# Patient Record
Sex: Female | Born: 1975 | Race: White | Hispanic: No | Marital: Married | Smoking: Former smoker
Health system: Southern US, Community
[De-identification: ages and names within clinical notes are randomized; demographics above are authoritative.]

## PROBLEM LIST (undated history)

## (undated) DIAGNOSIS — K9189 Other postprocedural complications and disorders of digestive system: Secondary | ICD-10-CM

## (undated) DIAGNOSIS — N939 Abnormal uterine and vaginal bleeding, unspecified: Secondary | ICD-10-CM

## (undated) DIAGNOSIS — K219 Gastro-esophageal reflux disease without esophagitis: Secondary | ICD-10-CM

## (undated) DIAGNOSIS — D649 Anemia, unspecified: Secondary | ICD-10-CM

## (undated) DIAGNOSIS — E079 Disorder of thyroid, unspecified: Secondary | ICD-10-CM

## (undated) DIAGNOSIS — F419 Anxiety disorder, unspecified: Secondary | ICD-10-CM

## (undated) DIAGNOSIS — T7840XA Allergy, unspecified, initial encounter: Secondary | ICD-10-CM

## (undated) DIAGNOSIS — Z87891 Personal history of nicotine dependence: Secondary | ICD-10-CM

## (undated) DIAGNOSIS — Z800 Family history of malignant neoplasm of digestive organs: Secondary | ICD-10-CM

## (undated) DIAGNOSIS — R197 Diarrhea, unspecified: Secondary | ICD-10-CM

## (undated) HISTORY — PX: TUBAL LIGATION: SHX77

## (undated) HISTORY — PX: CERVICAL BIOPSY: SHX590

## (undated) HISTORY — DX: Gastro-esophageal reflux disease without esophagitis: K21.9

## (undated) HISTORY — PX: DIAGNOSTIC LAPAROSCOPY: SUR761

## (undated) HISTORY — DX: Allergy, unspecified, initial encounter: T78.40XA

## (undated) HISTORY — DX: Disorder of thyroid, unspecified: E07.9

---

## 2005-07-13 ENCOUNTER — Emergency Department: Payer: Self-pay | Admitting: General Practice

## 2006-12-13 ENCOUNTER — Ambulatory Visit: Payer: Self-pay

## 2007-01-23 ENCOUNTER — Observation Stay: Payer: Self-pay | Admitting: Obstetrics and Gynecology

## 2007-01-24 ENCOUNTER — Inpatient Hospital Stay: Payer: Self-pay | Admitting: Obstetrics and Gynecology

## 2009-03-26 HISTORY — PX: CHOLECYSTECTOMY: SHX55

## 2009-05-10 ENCOUNTER — Ambulatory Visit: Payer: Self-pay | Admitting: Family Medicine

## 2009-06-20 ENCOUNTER — Emergency Department: Payer: Self-pay | Admitting: Emergency Medicine

## 2009-06-23 ENCOUNTER — Ambulatory Visit: Payer: Self-pay | Admitting: Surgery

## 2010-05-03 ENCOUNTER — Ambulatory Visit: Payer: Self-pay | Admitting: Obstetrics and Gynecology

## 2010-05-04 ENCOUNTER — Inpatient Hospital Stay: Payer: Self-pay | Admitting: Obstetrics and Gynecology

## 2010-05-05 LAB — PATHOLOGY REPORT

## 2011-12-17 IMAGING — US ABDOMEN ULTRASOUND
1 series · 17 of 25 positions shown · non-contrast
Comparison: none

REASON FOR EXAM: cholelithiasis
COMMENTS:

[Series 1: abdomen ultrasound · 17 of 59 slices shown]
[im 1/59]
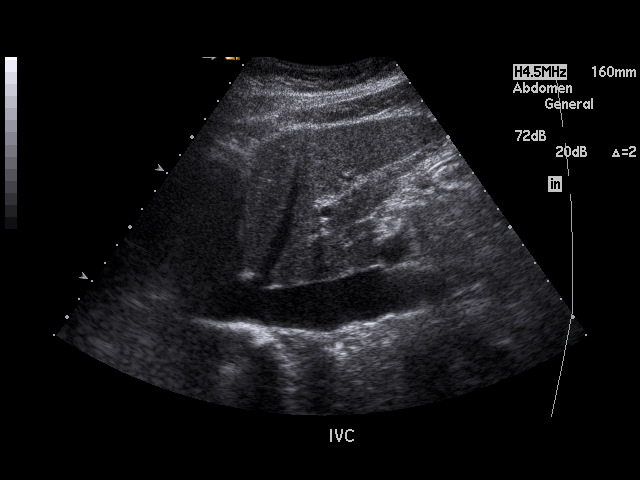
[im 5/59]
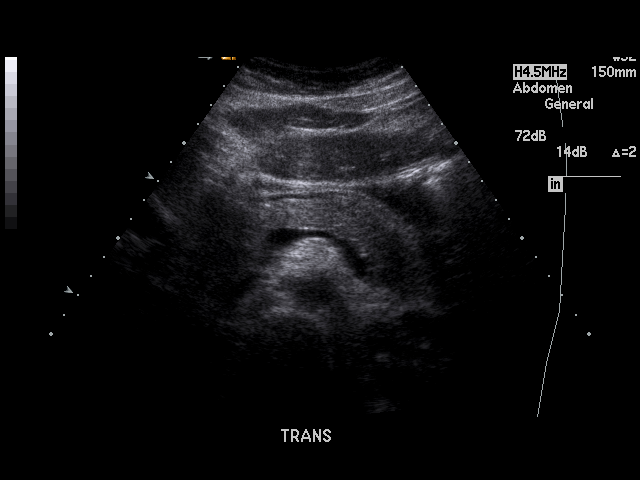
[im 8/59]
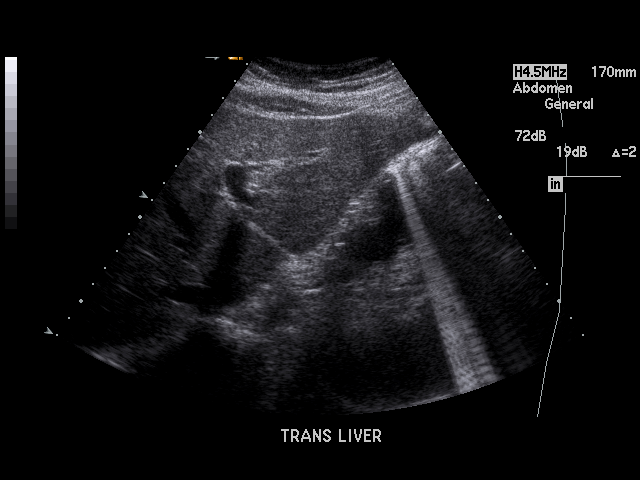
[im 13/59]
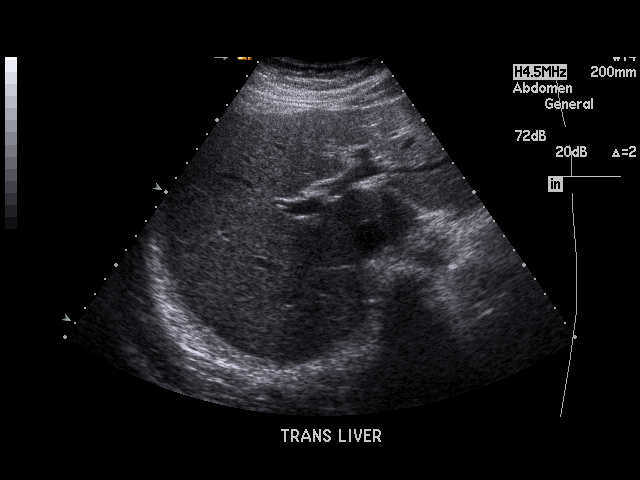
[im 15/59]
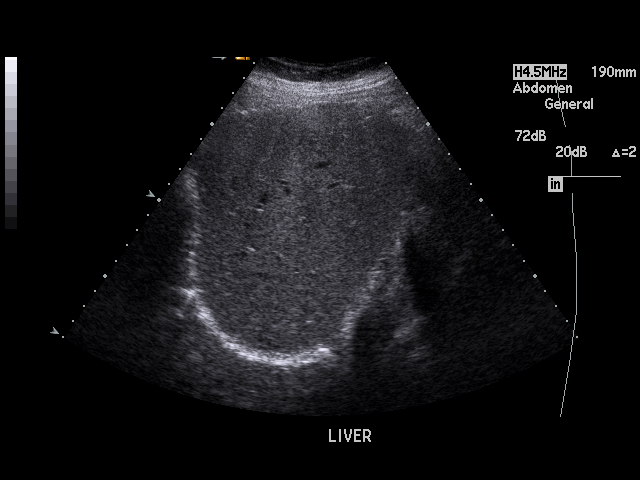
[im 20/59]
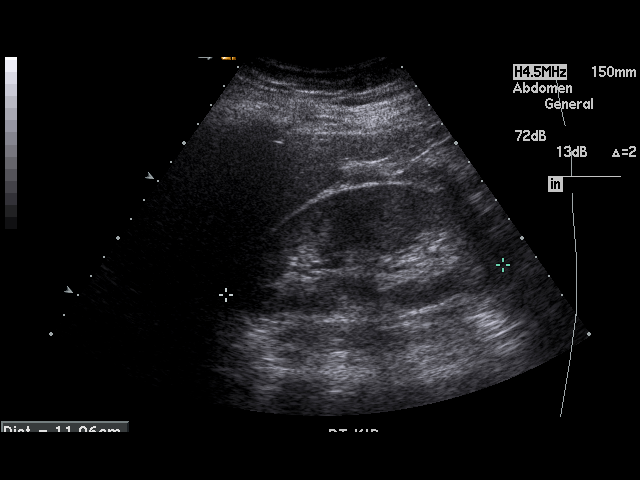
[im 22/59]
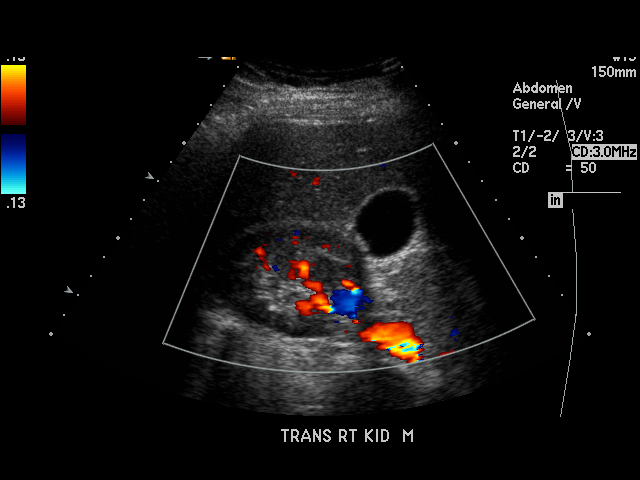
[im 27/59]
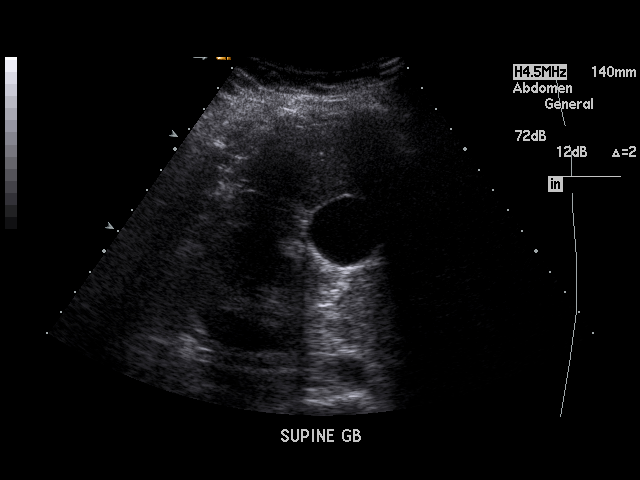
[im 30/59]
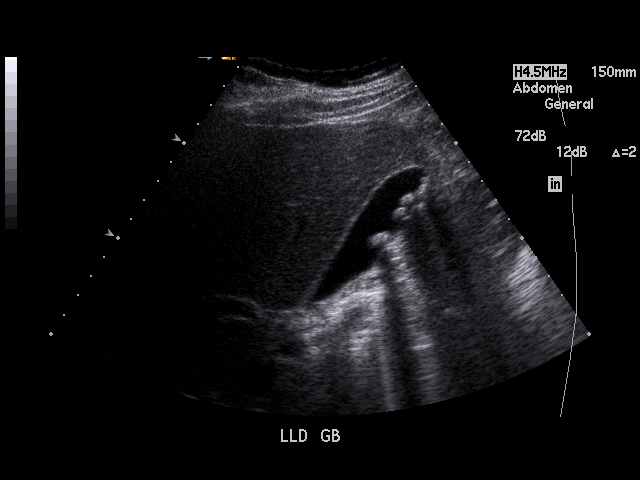
[im 32/59]
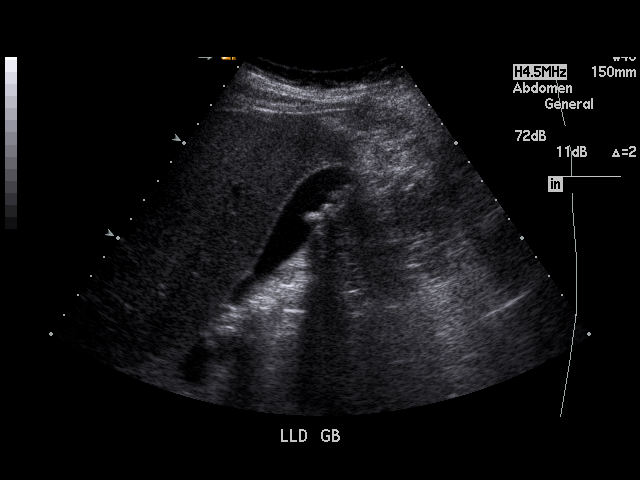
[im 37/59]
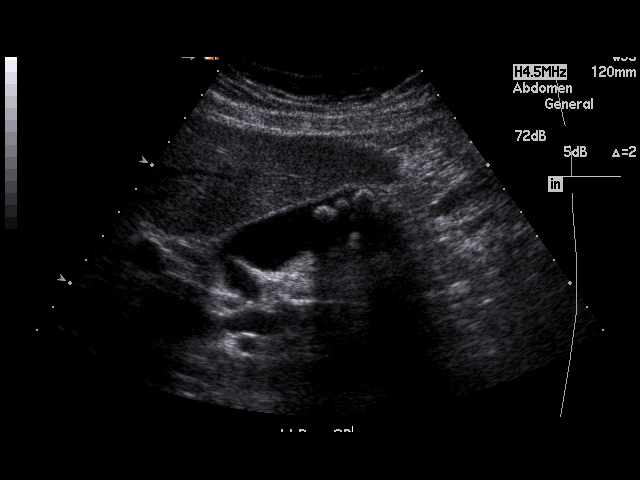
[im 39/59]
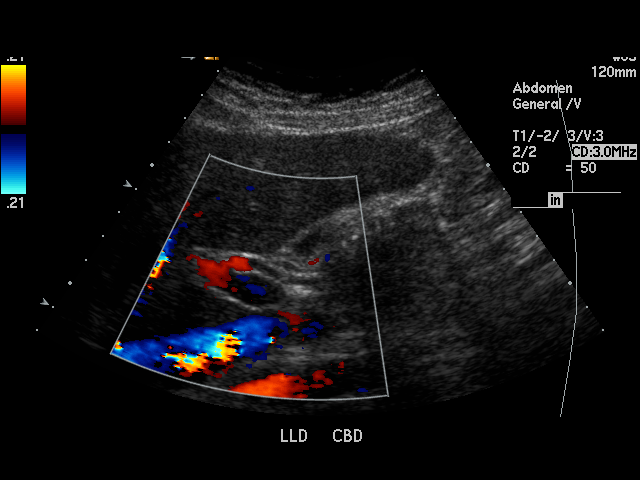
[im 44/59]
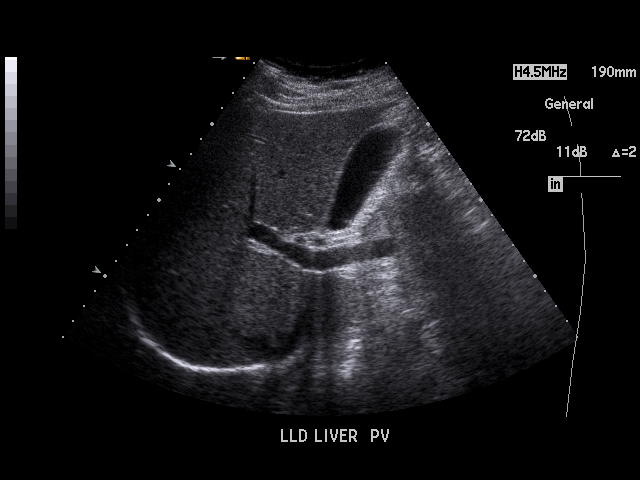
[im 46/59]
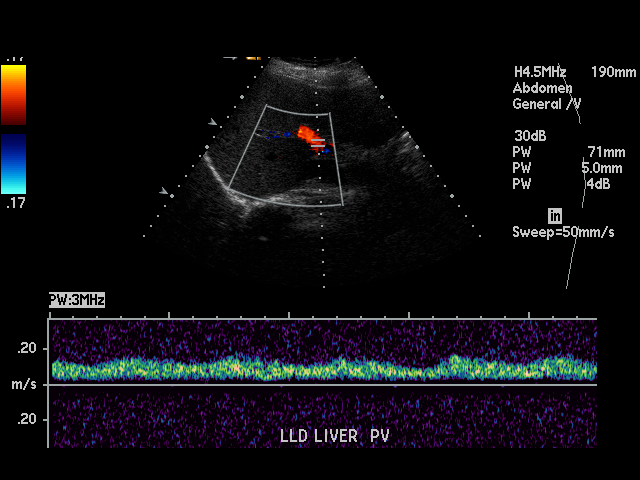
[im 51/59]
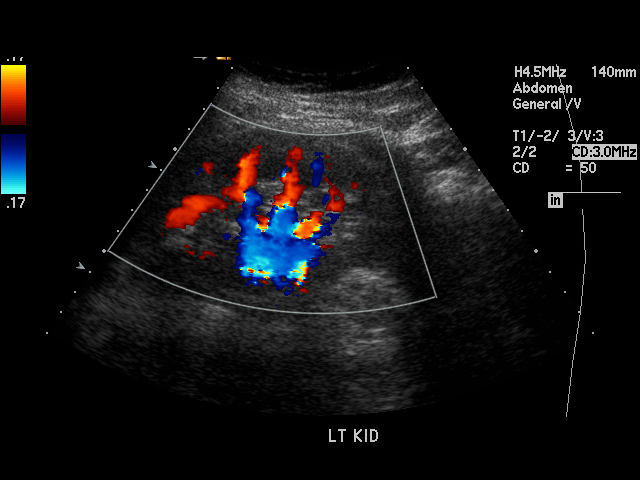
[im 54/59]
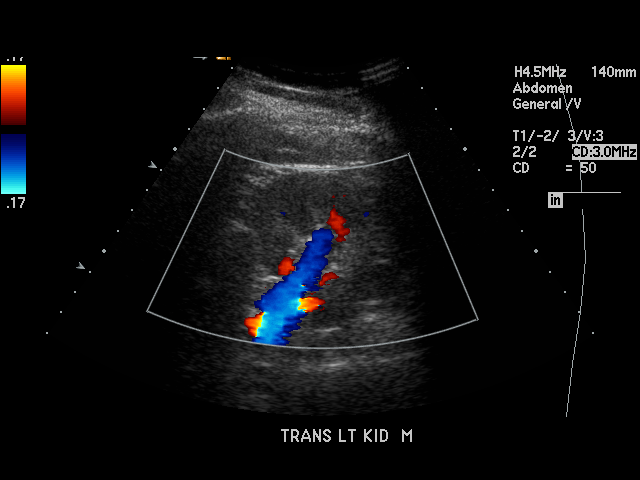
[im 59/59]
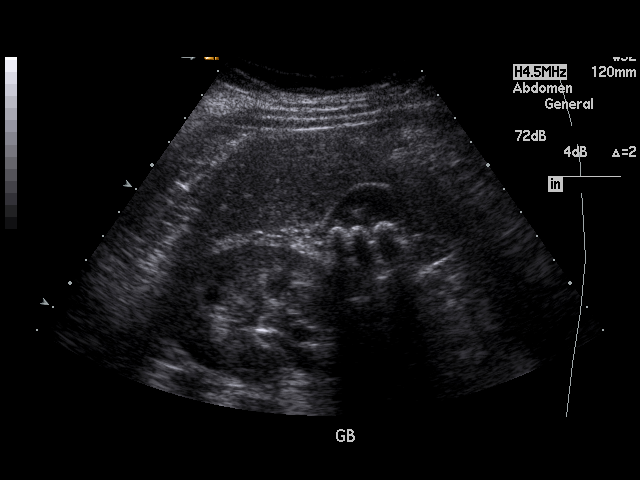

[17 of 25 positions shown; findings below may reference images not displayed]

PROCEDURE:     US  - US ABDOMEN GENERAL SURVEY  - May 10, 2009  [DATE]

RESULT:     The liver, spleen, pancreas, abdominal aorta and inferior vena
cava show no significant abnormalities. There are multiple, mobile
echodensities in the gallbladder compatible with gallstones. There is no
thickening of the gallbladder wall which measures 1.9 mm in thickness. The
common bile duct is normal in size and measures 4.2 mm at maximum diameter.
The right and left kidneys are visualized. No hydronephrosis is seen. There
is no ascites.
IMPRESSION: Cholelithiasis. No associated thickening of the gallbladder
wall or pericholecystic fluid is seen.

## 2013-10-05 LAB — BASIC METABOLIC PANEL
BUN: 10 mg/dL (ref 4–21)
Creatinine: 0.6 mg/dL (ref 0.5–1.1)
GLUCOSE: 77 mg/dL
Potassium: 4.2 mmol/L (ref 3.4–5.3)
Sodium: 139 mmol/L (ref 137–147)

## 2013-10-05 LAB — CBC AND DIFFERENTIAL
HCT: 39 % (ref 36–46)
HEMOGLOBIN: 13.5 g/dL (ref 12.0–16.0)
Platelets: 375 10*3/uL (ref 150–399)
WBC: 7.8 10*3/mL

## 2013-10-05 LAB — HEPATIC FUNCTION PANEL
ALT: 21 U/L (ref 7–35)
AST: 12 U/L — AB (ref 13–35)

## 2013-10-12 ENCOUNTER — Ambulatory Visit: Payer: Self-pay | Admitting: Family Medicine

## 2014-12-02 ENCOUNTER — Ambulatory Visit (INDEPENDENT_AMBULATORY_CARE_PROVIDER_SITE_OTHER): Payer: BLUE CROSS/BLUE SHIELD | Admitting: Family Medicine

## 2014-12-02 ENCOUNTER — Encounter: Payer: Self-pay | Admitting: Family Medicine

## 2014-12-02 VITALS — BP 100/68 | HR 72 | Temp 98.2°F | Resp 16 | Ht 65.0 in | Wt 174.0 lb

## 2014-12-02 DIAGNOSIS — Z9049 Acquired absence of other specified parts of digestive tract: Secondary | ICD-10-CM

## 2014-12-02 DIAGNOSIS — K9189 Other postprocedural complications and disorders of digestive system: Secondary | ICD-10-CM

## 2014-12-02 DIAGNOSIS — E049 Nontoxic goiter, unspecified: Secondary | ICD-10-CM

## 2014-12-02 DIAGNOSIS — R197 Diarrhea, unspecified: Secondary | ICD-10-CM

## 2014-12-02 DIAGNOSIS — J309 Allergic rhinitis, unspecified: Secondary | ICD-10-CM | POA: Insufficient documentation

## 2014-12-02 DIAGNOSIS — E010 Iodine-deficiency related diffuse (endemic) goiter: Secondary | ICD-10-CM | POA: Insufficient documentation

## 2014-12-02 DIAGNOSIS — K219 Gastro-esophageal reflux disease without esophagitis: Secondary | ICD-10-CM | POA: Insufficient documentation

## 2014-12-02 DIAGNOSIS — R5383 Other fatigue: Secondary | ICD-10-CM | POA: Insufficient documentation

## 2014-12-02 DIAGNOSIS — B353 Tinea pedis: Secondary | ICD-10-CM

## 2014-12-02 MED ORDER — KETOCONAZOLE 2 % EX CREA: 1.0000 "application " | TOPICAL_CREAM | Freq: Two times a day (BID) | CUTANEOUS | Status: AC | PRN

## 2014-12-02 MED ORDER — CHOLESTYRAMINE 4 G PO PACK: 4.0000 g | PACK | Freq: Two times a day (BID) | ORAL | Status: AC

## 2014-12-02 NOTE — Progress Notes (Signed)
Subjective:    Patient ID: Alice Guerrero, female    DOB: 05/13/1975, 39 y.o.   MRN: [id number]  Fatigue: Patient complains of fatigue. Symptoms began 3 months ago. Sentinal symptom the patient feels fatigue began with: diarrhea/constpation. Symptoms of her fatigue have been change in appetite, decreased libido, general malaise, headaches, intermittent intestinal cramping and loose stools and anxiousness (due to GI problems). Patient describes the following psychologic symptoms: stress in the family.  Patient denies fever, significant change in weight, symptoms of arthritis, exercise intolerance, unusual rashes and witnessed or suspected sleep apnea. Symptoms have gradually worsened. Severity has been moderate. Previous visits for this problem: yes, last seen 1 year ago by the patient's PCP.    HPI Comments: Pt is concerned that her sx may be related to thyroid. Pt's last TSH was checked 10/05/2013, and was 2.850 (all other labs WNL). Pt also has thyroid US performed on 10/12/2013 for enlarged thyroid, and was WNL.  GI Problem The primary symptoms include fatigue, abdominal pain, nausea and diarrhea. Primary symptoms do not include fever, weight loss, vomiting, melena, hematemesis, jaundice, hematochezia, myalgias, arthralgias or rash. Episode onset: 3 months. The onset was gradual. The problem has been gradually worsening.  The fatigue has been worsening since its onset. The fatigue is worsened by nothing.  The illness is also significant for anorexia, bloating and constipation. The illness does not include chills, dysphagia or back pain.  Pt had her gallbladder removed about 3 years ago. Pt also reports that she is having up to 6 BM per day, which is unusual for her. Used to be related to certain foods, but not as much any more.   Has tried Metamucil. Does not feel that she is under any more stress than usual.   Does not have a lot of time for herself. Did sign up with a trainer and is going to  start Monday.     Review of Systems  Constitutional: Positive for appetite change and fatigue. Negative for fever, chills, weight loss, diaphoresis and activity change.  Respiratory: Negative for apnea and cough.   Cardiovascular: Negative for chest pain, palpitations and leg swelling.  Gastrointestinal: Positive for nausea, abdominal pain, diarrhea, constipation, bloating and anorexia. Negative for dysphagia, vomiting, melena, hematochezia, hematemesis and jaundice.  Musculoskeletal: Negative for myalgias, back pain and arthralgias.  Skin: Negative for rash.  Psychiatric/Behavioral: The patient is nervous/anxious (secondary to GI problems).    BP 100/68 mmHg  Pulse 72  Temp(Src) 98.2 F (36.8 C) (Oral)  Resp 16  Ht 5\' 5"  (1.651 m)  Wt 174 lb (78.926 kg)  BMI 28.96 kg/m2  LMP 11/18/2014 (Approximate)   Patient Active Problem List   Diagnosis Date Noted  . Allergic rhinitis 12/02/2014  . Big thyroid 12/02/2014  . Fatigue 12/02/2014  . Acid reflux 12/02/2014   Past Medical History  Diagnosis Date  . Allergy   . GERD (gastroesophageal reflux disease)   . Thyroid disease    No current outpatient prescriptions on file prior to visit.   No current facility-administered medications on file prior to visit.   No Known Allergies Past Surgical History  Procedure Laterality Date  . Cesarean section  2012  . Cholecystectomy  2011   Social History   Social History  . Marital Status: Married    Spouse Name: N/A  . Number of Children: 3  . Years of Education: N/A   Occupational History  . Not on file.   Social History Main Topics  .  Smoking status: Former Smoker    Quit date: 03/25/2006  . Smokeless tobacco: Never Used  . Alcohol Use: Yes     Comment: occasional  . Drug Use: No  . Sexual Activity: Not on file   Other Topics Concern  . Not on file   Social History Narrative   Family History  Problem Relation Age of Onset  . CVA Mother   . Hypertension Father    . Deep vein thrombosis Father        Objective:   Physical Exam  Constitutional: She is oriented to person, place, and time. She appears well-developed and well-nourished.  Neck: Normal range of motion. Neck supple.  Cardiovascular: Normal rate and regular rhythm.   Pulmonary/Chest: Effort normal and breath sounds normal.  Abdominal: Soft. Bowel sounds are normal.  Neurological: She is alert and oriented to person, place, and time.  Skin: Rash: on left foot. There is erythema.  Psychiatric: She has a normal mood and affect. Her behavior is normal. Judgment and thought content normal.   BP 100/68 mmHg  Pulse 72  Temp(Src) 98.2 F (36.8 C) (Oral)  Resp 16  Ht  (1.651 m)  Wt 174 lb (78.926 kg)  BMI 28.96 kg/m2  LMP 11/18/2014 (Approximate)       Assessment & Plan:  1. Big thyroid Recheck labs secondary concern that is causing current symptoms.  - TSH  2. Postcholecystectomy diarrhea Worsening. Will treat with cholestyramine.  Call if not improving and will increase dose.  - CBC with Differential/Platelet - Comprehensive metabolic panel  3. Tinea pedis, unspecified laterality Condition is worsening. Will start medication for better control.  Patient instructed to call back if condition worsens or does not improve.    - ketoconazole (NIZORAL) 2 % cream; Apply 1 application topically 2 (two) times daily as needed for irritation.  Dispense: 30 g; Refill: 0  Lorie Phenix, MD

## 2014-12-03 LAB — COMPREHENSIVE METABOLIC PANEL
ALBUMIN: 4.6 g/dL (ref 3.5–5.5)
ALK PHOS: 94 IU/L (ref 39–117)
ALT: 32 IU/L (ref 0–32)
AST: 19 IU/L (ref 0–40)
Albumin/Globulin Ratio: 1.9 (ref 1.1–2.5)
BILIRUBIN TOTAL: 0.7 mg/dL (ref 0.0–1.2)
BUN / CREAT RATIO: 20 (ref 8–20)
BUN: 10 mg/dL (ref 6–20)
CHLORIDE: 100 mmol/L (ref 97–108)
CO2: 23 mmol/L (ref 18–29)
Calcium: 9.6 mg/dL (ref 8.7–10.2)
Creatinine, Ser: 0.5 mg/dL — ABNORMAL LOW (ref 0.57–1.00)
GFR calc Af Amer: 143 mL/min/{1.73_m2} (ref >59)
GFR calc non Af Amer: 124 mL/min/{1.73_m2} (ref >59)
Globulin, Total: 2.4 g/dL (ref 1.5–4.5)
Glucose: 87 mg/dL (ref 65–99)
Potassium: 5.5 mmol/L — ABNORMAL HIGH (ref 3.5–5.2)
Sodium: 142 mmol/L (ref 134–144)
Total Protein: 7 g/dL (ref 6.0–8.5)

## 2014-12-03 LAB — CBC WITH DIFFERENTIAL/PLATELET
BASOS ABS: 0 10*3/uL (ref 0.0–0.2)
Basos: 1 %
EOS (ABSOLUTE): 0.1 10*3/uL (ref 0.0–0.4)
Eos: 1 %
HEMOGLOBIN: 13.6 g/dL (ref 11.1–15.9)
Hematocrit: 39.9 % (ref 34.0–46.6)
Immature Grans (Abs): 0 10*3/uL (ref 0.0–0.1)
Immature Granulocytes: 0 %
Lymphocytes Absolute: 2.3 10*3/uL (ref 0.7–3.1)
Lymphs: 28 %
MCH: 31.7 pg (ref 26.6–33.0)
MCHC: 34.1 g/dL (ref 31.5–35.7)
MCV: 93 fL (ref 79–97)
MONOCYTES: 6 %
MONOS ABS: 0.5 10*3/uL (ref 0.1–0.9)
NEUTROS ABS: 5.3 10*3/uL (ref 1.4–7.0)
Neutrophils: 64 %
Platelets: 378 10*3/uL (ref 150–379)
RBC: 4.29 x10E6/uL (ref 3.77–5.28)
RDW: 12.5 % (ref 12.3–15.4)
WBC: 8.2 10*3/uL (ref 3.4–10.8)

## 2014-12-03 LAB — TSH: TSH: 0.814 u[IU]/mL (ref 0.450–4.500)

## 2014-12-06 ENCOUNTER — Telehealth: Payer: Self-pay

## 2014-12-06 DIAGNOSIS — E875 Hyperkalemia: Secondary | ICD-10-CM | POA: Insufficient documentation

## 2014-12-06 NOTE — Telephone Encounter (Signed)
-----   Message from Lorie Phenix, MD sent at 12/03/2014  9:34 AM EDT ----- Labs stable.  Potassium is mildly increased. Could be related to lab draw.  Recommend stay well hydrated, make sure no potassium supplements or sports drinks and recheck in 2 weeks. Thanks.

## 2014-12-06 NOTE — Telephone Encounter (Signed)
Pt advised as directed below;  Lab slip is at the front desk to pick up.    Thanks,   -Vernona Rieger

## 2016-05-20 IMAGING — US THYROID ULTRASOUND
1 series · 14 of 25 positions shown · non-contrast
Comparison: None.

CLINICAL DATA: Enlarged thyroid gland

EXAM:
THYROID ULTRASOUND
TECHNIQUE: Ultrasound examination of the thyroid gland and adjacent soft
tissues was performed.

[Series 1: thyroid ultrasound · 0.08mm/px · 14 of 36 slices shown]
[im 1/36]
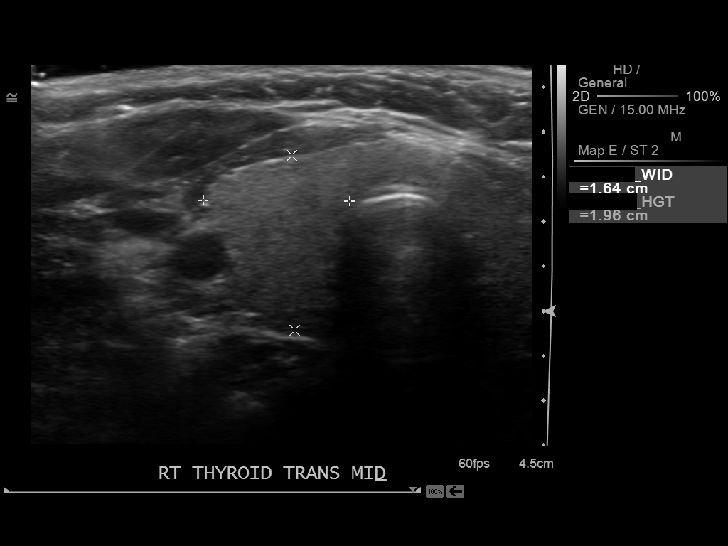
[im 3/36]
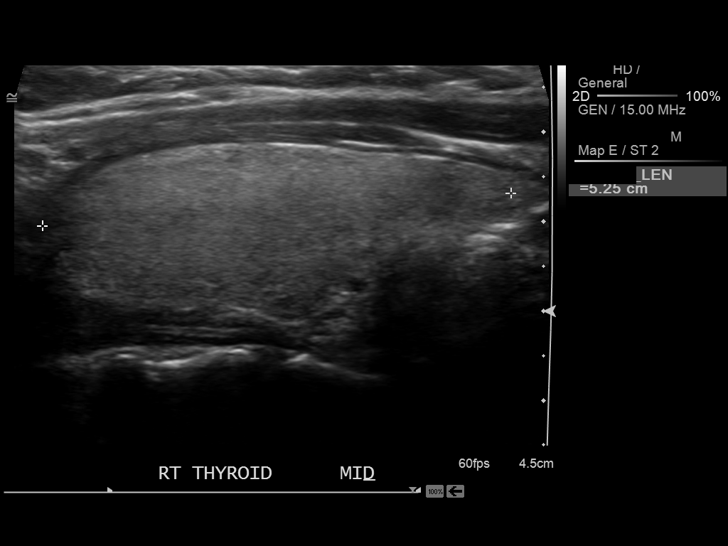
[im 6/36]
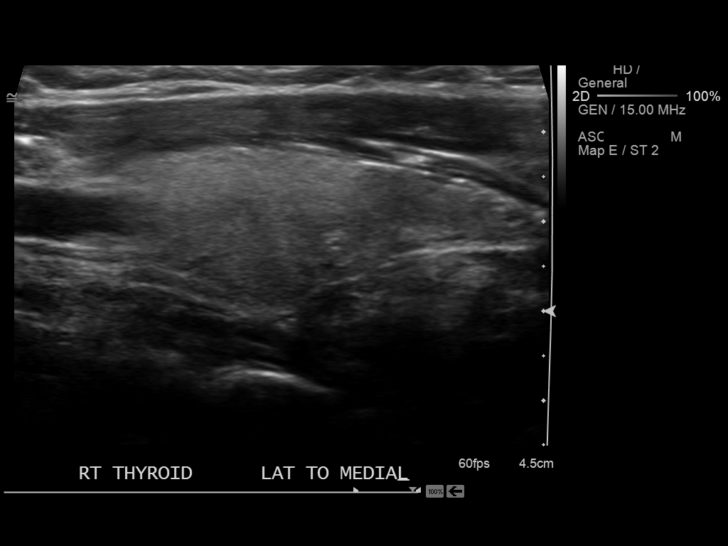
[im 9/36]
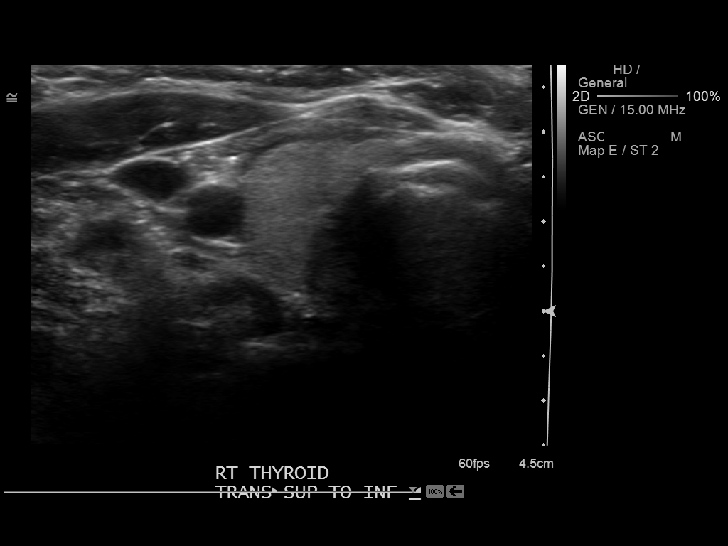
[im 12/36]
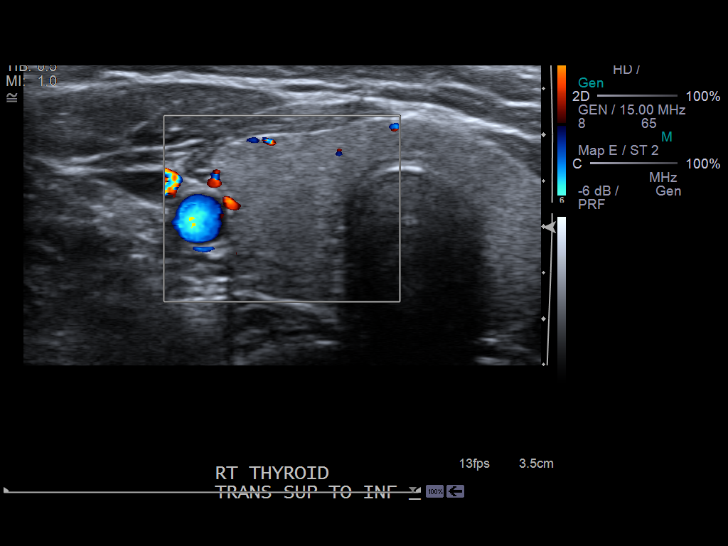
[im 14/36]
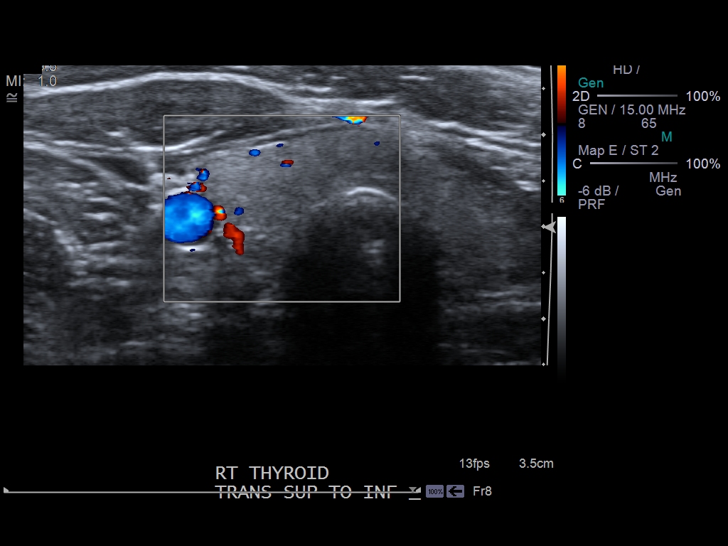
[im 17/36]
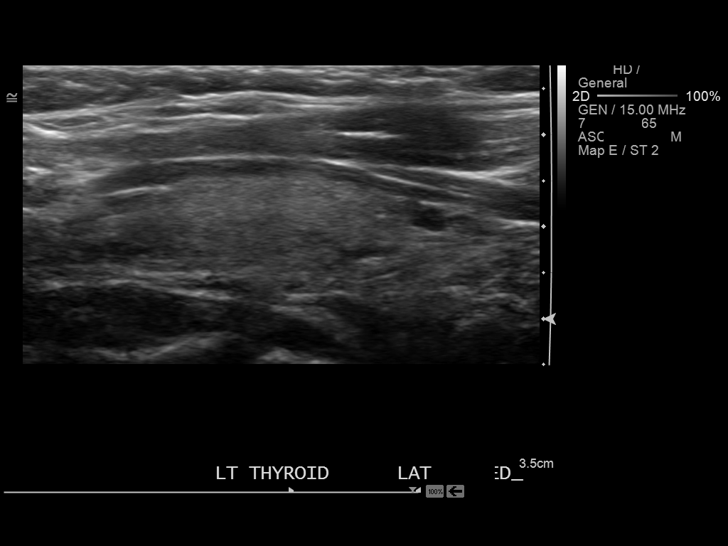
[im 19/36]
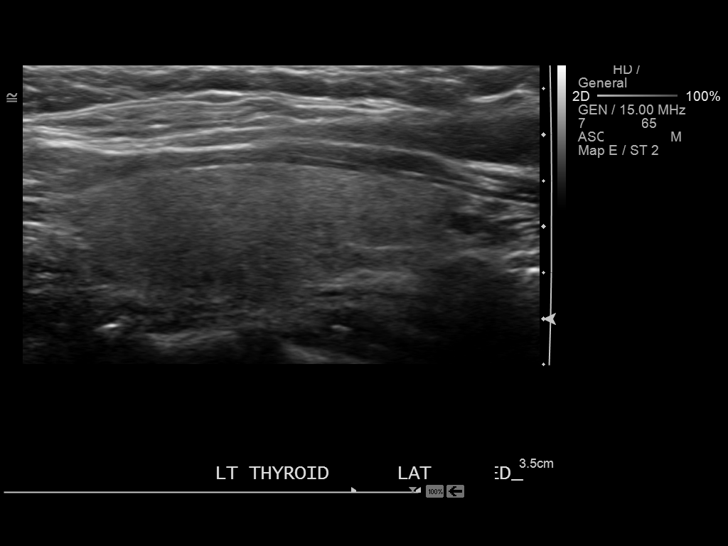
[im 22/36]
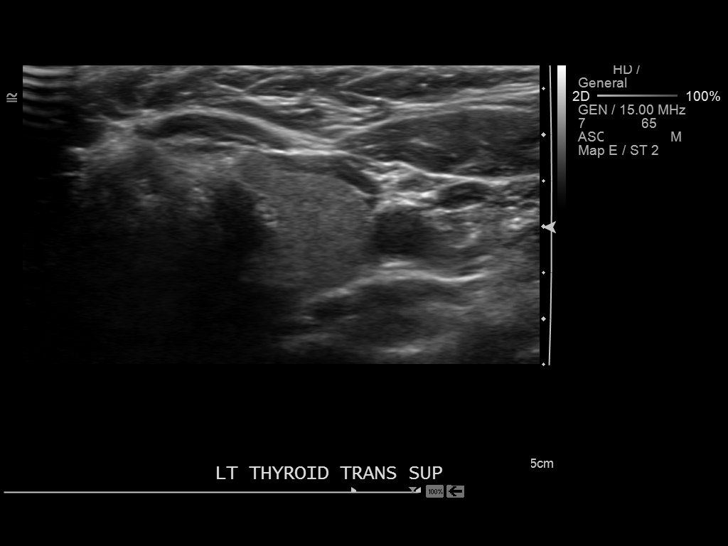
[im 24/36]
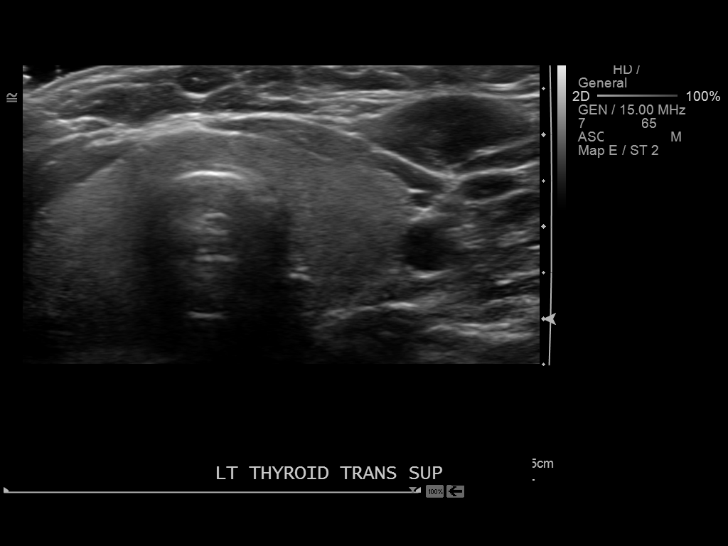
[im 27/36]
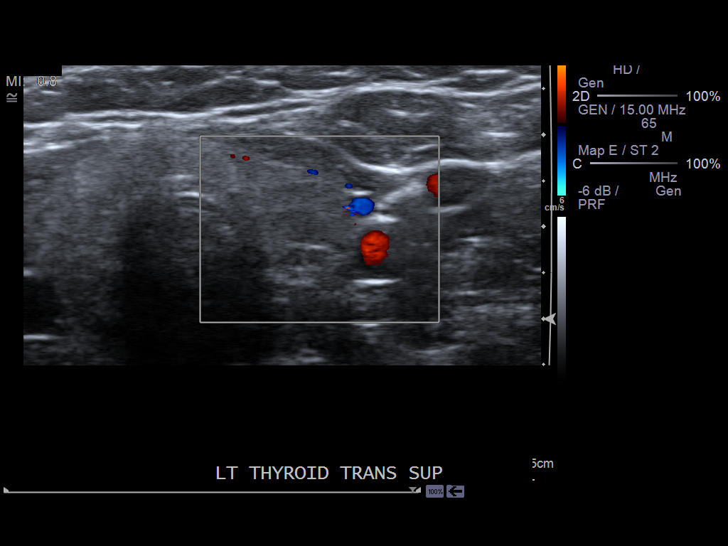
[im 30/36]
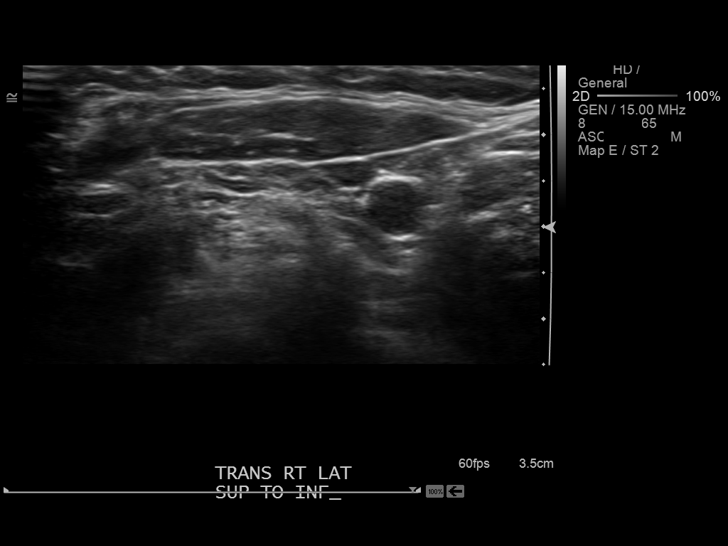
[im 33/36]
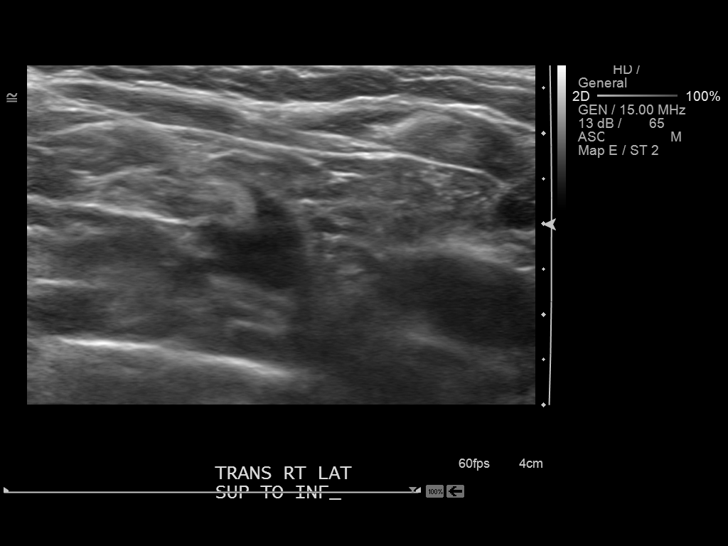
[im 36/36]
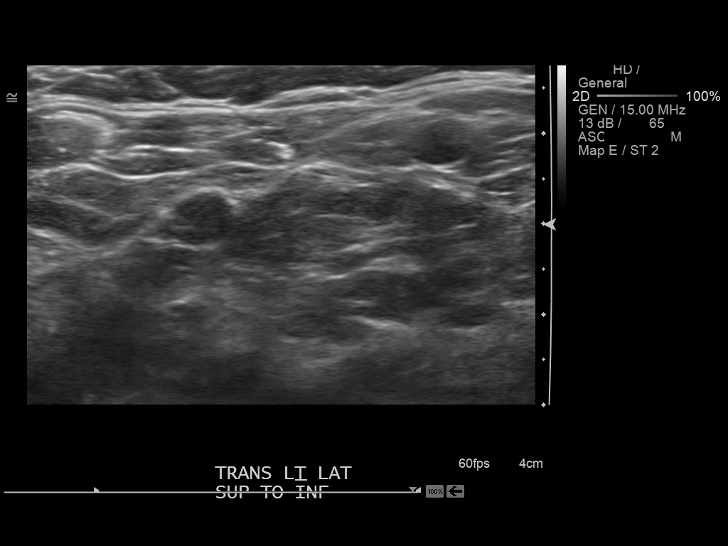

[14 of 25 positions shown; findings below may reference images not displayed]

FINDINGS: Right thyroid lobe

Measurements: 5.3 x 2.0 x 1.6 cm.  No nodules visualized.

Left thyroid lobe

Measurements: 4.5 x 1.4 x 1.6 cm.  No nodules visualized.

Isthmus

Thickness: 4 mm.  No nodules visualized.

Lymphadenopathy

None visualized.
IMPRESSION: No evidence of nodule.  Within normal limits.

## 2016-07-04 ENCOUNTER — Encounter: Payer: BLUE CROSS/BLUE SHIELD | Admitting: Obstetrics and Gynecology

## 2016-09-07 ENCOUNTER — Encounter: Payer: BLUE CROSS/BLUE SHIELD | Admitting: Obstetrics and Gynecology

## 2016-10-03 ENCOUNTER — Encounter: Payer: BLUE CROSS/BLUE SHIELD | Admitting: Obstetrics and Gynecology

## 2016-11-28 ENCOUNTER — Encounter: Payer: BLUE CROSS/BLUE SHIELD | Admitting: Obstetrics and Gynecology

## 2016-12-26 ENCOUNTER — Encounter: Payer: BLUE CROSS/BLUE SHIELD | Admitting: Obstetrics and Gynecology

## 2020-10-04 ENCOUNTER — Other Ambulatory Visit: Payer: Self-pay | Admitting: Family

## 2020-10-04 DIAGNOSIS — Z1231 Encounter for screening mammogram for malignant neoplasm of breast: Secondary | ICD-10-CM

## 2020-10-13 ENCOUNTER — Ambulatory Visit
Admission: RE | Admit: 2020-10-13 | Discharge: 2020-10-13 | Disposition: A | Discharge status: Home / Self Care | Payer: 59 | Source: Ambulatory Visit | Attending: Family | Admitting: Family

## 2020-10-13 ENCOUNTER — Other Ambulatory Visit: Payer: Self-pay

## 2020-10-13 DIAGNOSIS — Z1231 Encounter for screening mammogram for malignant neoplasm of breast: Secondary | ICD-10-CM | POA: Diagnosis present

## 2021-05-09 ENCOUNTER — Encounter: Admission: RE | Payer: Self-pay | Source: Home / Self Care

## 2021-05-09 ENCOUNTER — Ambulatory Visit: Admission: RE | Admit: 2021-05-09 | Payer: 59 | Source: Home / Self Care

## 2021-05-09 SURGERY — COLONOSCOPY
Anesthesia: General

## 2022-05-03 ENCOUNTER — Other Ambulatory Visit: Payer: Self-pay

## 2022-05-03 DIAGNOSIS — Z1231 Encounter for screening mammogram for malignant neoplasm of breast: Secondary | ICD-10-CM

## 2022-05-23 ENCOUNTER — Ambulatory Visit
Admission: RE | Admit: 2022-05-23 | Discharge: 2022-05-23 | Disposition: A | Discharge status: Home / Self Care | Payer: 59 | Source: Ambulatory Visit | Attending: Family Medicine | Admitting: Family Medicine

## 2022-05-23 DIAGNOSIS — Z1231 Encounter for screening mammogram for malignant neoplasm of breast: Secondary | ICD-10-CM | POA: Diagnosis present

## 2022-05-28 ENCOUNTER — Other Ambulatory Visit: Payer: Self-pay | Admitting: Family Medicine

## 2022-05-28 DIAGNOSIS — R928 Other abnormal and inconclusive findings on diagnostic imaging of breast: Secondary | ICD-10-CM

## 2022-05-28 DIAGNOSIS — N6489 Other specified disorders of breast: Secondary | ICD-10-CM

## 2022-06-01 ENCOUNTER — Ambulatory Visit
Admission: RE | Admit: 2022-06-01 | Discharge: 2022-06-01 | Disposition: A | Discharge status: Home / Self Care | Payer: 59 | Source: Ambulatory Visit | Attending: Family Medicine | Admitting: Family Medicine

## 2022-06-01 DIAGNOSIS — N6001 Solitary cyst of right breast: Secondary | ICD-10-CM | POA: Insufficient documentation

## 2022-06-01 DIAGNOSIS — R928 Other abnormal and inconclusive findings on diagnostic imaging of breast: Secondary | ICD-10-CM

## 2022-06-01 DIAGNOSIS — Z1239 Encounter for other screening for malignant neoplasm of breast: Secondary | ICD-10-CM | POA: Diagnosis not present

## 2022-06-01 DIAGNOSIS — N6489 Other specified disorders of breast: Secondary | ICD-10-CM | POA: Diagnosis present

## 2022-06-25 ENCOUNTER — Other Ambulatory Visit: Payer: Self-pay | Admitting: Family

## 2022-08-23 ENCOUNTER — Other Ambulatory Visit: Payer: Self-pay | Admitting: Family Medicine

## 2022-08-23 DIAGNOSIS — E785 Hyperlipidemia, unspecified: Secondary | ICD-10-CM

## 2022-08-29 ENCOUNTER — Ambulatory Visit
Admission: RE | Admit: 2022-08-29 | Discharge: 2022-08-29 | Disposition: A | Discharge status: Home / Self Care | Payer: Self-pay | Source: Ambulatory Visit | Attending: Family Medicine | Admitting: Family Medicine

## 2022-08-29 DIAGNOSIS — E785 Hyperlipidemia, unspecified: Secondary | ICD-10-CM | POA: Insufficient documentation

## 2022-10-15 ENCOUNTER — Encounter: Payer: Self-pay | Admitting: *Deleted

## 2022-10-22 ENCOUNTER — Encounter: Payer: Self-pay | Admitting: *Deleted

## 2022-10-23 ENCOUNTER — Ambulatory Visit: Payer: 59 | Admitting: Anesthesiology

## 2022-10-23 ENCOUNTER — Encounter
Admission: RE | Disposition: A | Discharge status: Home / Self Care | Payer: Self-pay | Source: Home / Self Care | Attending: Gastroenterology

## 2022-10-23 ENCOUNTER — Encounter: Payer: Self-pay | Admitting: *Deleted

## 2022-10-23 ENCOUNTER — Ambulatory Visit
Admission: RE | Admit: 2022-10-23 | Discharge: 2022-10-23 | Disposition: A | Discharge status: Home / Self Care | Payer: 59 | Attending: Gastroenterology | Admitting: Gastroenterology

## 2022-10-23 DIAGNOSIS — D122 Benign neoplasm of ascending colon: Secondary | ICD-10-CM | POA: Insufficient documentation

## 2022-10-23 DIAGNOSIS — Z09 Encounter for follow-up examination after completed treatment for conditions other than malignant neoplasm: Secondary | ICD-10-CM | POA: Insufficient documentation

## 2022-10-23 DIAGNOSIS — K640 First degree hemorrhoids: Secondary | ICD-10-CM | POA: Insufficient documentation

## 2022-10-23 DIAGNOSIS — Z79899 Other long term (current) drug therapy: Secondary | ICD-10-CM | POA: Diagnosis not present

## 2022-10-23 DIAGNOSIS — E785 Hyperlipidemia, unspecified: Secondary | ICD-10-CM | POA: Insufficient documentation

## 2022-10-23 DIAGNOSIS — Z87891 Personal history of nicotine dependence: Secondary | ICD-10-CM | POA: Diagnosis not present

## 2022-10-23 DIAGNOSIS — K219 Gastro-esophageal reflux disease without esophagitis: Secondary | ICD-10-CM | POA: Diagnosis not present

## 2022-10-23 DIAGNOSIS — K648 Other hemorrhoids: Secondary | ICD-10-CM | POA: Insufficient documentation

## 2022-10-23 DIAGNOSIS — Z1211 Encounter for screening for malignant neoplasm of colon: Secondary | ICD-10-CM | POA: Diagnosis present

## 2022-10-23 DIAGNOSIS — Z800 Family history of malignant neoplasm of digestive organs: Secondary | ICD-10-CM | POA: Diagnosis not present

## 2022-10-23 DIAGNOSIS — D123 Benign neoplasm of transverse colon: Secondary | ICD-10-CM | POA: Diagnosis not present

## 2022-10-23 HISTORY — DX: Diarrhea, unspecified: R19.7

## 2022-10-23 HISTORY — PX: COLONOSCOPY WITH PROPOFOL: SHX5780

## 2022-10-23 HISTORY — DX: Family history of malignant neoplasm of digestive organs: Z80.0

## 2022-10-23 HISTORY — PX: POLYPECTOMY: SHX5525

## 2022-10-23 LAB — POCT PREGNANCY, URINE: Preg Test, Ur: NEGATIVE

## 2022-10-23 SURGERY — COLONOSCOPY WITH PROPOFOL
Anesthesia: General

## 2022-10-23 MED ORDER — LIDOCAINE HCL (CARDIAC) PF 100 MG/5ML IV SOSY
PREFILLED_SYRINGE | INTRAVENOUS | Status: DC | PRN
  Administered 2022-10-23: 60 mg via INTRAVENOUS

## 2022-10-23 MED ORDER — PROPOFOL 10 MG/ML IV BOLUS
INTRAVENOUS | Status: DC | PRN
  Administered 2022-10-23: 60 mg via INTRAVENOUS
  Administered 2022-10-23: 40 mg via INTRAVENOUS
  Administered 2022-10-23: 50 mg via INTRAVENOUS

## 2022-10-23 MED ORDER — SODIUM CHLORIDE 0.9 % IV SOLN: INTRAVENOUS | Status: DC

## 2022-10-23 MED ORDER — LIDOCAINE HCL (PF) 2 % IJ SOLN
INTRAMUSCULAR | Status: AC
  Filled 2022-10-23: qty 5

## 2022-10-23 MED ORDER — PROPOFOL 500 MG/50ML IV EMUL
INTRAVENOUS | Status: DC | PRN
  Administered 2022-10-23: 100 ug/kg/min via INTRAVENOUS

## 2022-10-23 MED ORDER — PROPOFOL 10 MG/ML IV BOLUS
INTRAVENOUS | Status: AC
  Filled 2022-10-23: qty 20

## 2022-10-23 MED ORDER — DEXMEDETOMIDINE HCL IN NACL 80 MCG/20ML IV SOLN
INTRAVENOUS | Status: DC | PRN
  Administered 2022-10-23 (×2): 8 ug via INTRAVENOUS

## 2022-10-23 NOTE — H&P (Signed)
Outpatient short stay form Pre-procedure 10/23/2022  Alice Bill, MD  Primary Physician: Jerl Mina, MD  Reason for visit:  High risk screening  History of present illness:    47 y/o lady with history of HLD and family history of colon cancer in her mother at age 91 who is here for colon cancer screening. No blood thinners. No significant abdominal surgeries. Never had colonoscopy before.    Current Facility-Administered Medications:    0.9 %  sodium chloride infusion, , Intravenous, Continuous, Francessca Friis, Rossie Muskrat, MD, Last Rate: 20 mL/hr at 10/23/22 0759, New Bag at 10/23/22 0759  Medications Prior to Admission  Medication Sig Dispense Refill Last Dose   Rimegepant Sulfate (NURTEC) 75 MG TBDP DISSOLVE 1 TABLET UNDER THE TONGUE FOR MIGRAINE. MAX OF 1/DAY. NEED APPOINTMENTFOR FURTHER REFILLS. 8 tablet 0 Past Month   Rimegepant Sulfate (NURTEC) 75 MG TBDP Take 75 mg by mouth every 12 (twelve) hours as needed.      Ubrogepant (UBRELVY) 50 MG TABS Take 100 mg by mouth daily at 2 am.   Past Week   cholestyramine (QUESTRAN) 4 G packet Take 1 packet (4 g total) by mouth 2 (two) times daily. (Patient not taking: Reported on 10/23/2022) 60 each 12 Not Taking   colestipol (COLESTID) 1 g tablet Take 1 g by mouth 2 (two) times daily. (Patient not taking: Reported on 10/23/2022)   Not Taking   fluticasone (FLONASE) 50 MCG/ACT nasal spray Place 2 sprays into the nose daily.      ketoconazole (NIZORAL) 2 % cream Apply 1 application topically 2 (two) times daily as needed for irritation. 30 g 0    rosuvastatin (CRESTOR) 5 MG tablet Take 5 mg by mouth daily. (Patient not taking: Reported on 10/23/2022)   Not Taking     No Known Allergies   Past Medical History:  Diagnosis Date   Allergy    Diarrhea    FH: colon cancer    GERD (gastroesophageal reflux disease)    Thyroid disease     Review of systems:  Otherwise negative.    Physical Exam  Gen: Alert, oriented. Appears stated  age.  HEENT: PERRLA. Lungs: No respiratory distress CV: RRR Abd: soft, benign, no masses Ext: No edema    Planned procedures: Proceed with colonoscopy. The patient understands the nature of the planned procedure, indications, risks, alternatives and potential complications including but not limited to bleeding, infection, perforation, damage to internal organs and possible oversedation/side effects from anesthesia. The patient agrees and gives consent to proceed.  Please refer to procedure notes for findings, recommendations and patient disposition/instructions.     Alice Bill, MD [facility]

## 2022-10-23 NOTE — Anesthesia Procedure Notes (Signed)
Procedure Name: General with mask airway Date/Time: 10/23/2022 8:32 AM  Performed by: Lily Lovings, CRNAPre-anesthesia Checklist: Patient identified, Emergency Drugs available, Suction available, Patient being monitored and Timeout performed Patient Re-evaluated:Patient Re-evaluated prior to induction Oxygen Delivery Method: Simple face mask Preoxygenation: Pre-oxygenation with 100% oxygen Induction Type: IV induction

## 2022-10-23 NOTE — Transfer of Care (Signed)
Immediate Anesthesia Transfer of Care Note  Patient: Alice Guerrero  Procedure(s) Performed: COLONOSCOPY WITH PROPOFOL POLYPECTOMY  Patient Location: PACU and Endoscopy Unit  Anesthesia Type:General  Level of Consciousness: awake and patient cooperative  Airway & Oxygen Therapy: Patient Spontanous Breathing and Patient connected to face mask oxygen  Post-op Assessment: Report given to RN, Post -op Vital signs reviewed and stable, and Patient moving all extremities X 4  Post vital signs: Reviewed and stable  Last Vitals:  Vitals Value Taken Time  BP    Temp 36.1 C 10/23/22 0901  Pulse 87 10/23/22 0901  Resp 20 10/23/22 0901  SpO2 97 % 10/23/22 0901    Last Pain:  Vitals:   10/23/22 0901  TempSrc: Temporal  PainSc: 0-No pain         Complications: No notable events documented.

## 2022-10-23 NOTE — Anesthesia Preprocedure Evaluation (Addendum)
Anesthesia Evaluation  Patient identified by MRN, date of birth, ID band Patient awake    Reviewed: Allergy & Precautions, H&P , NPO status , Patient's Chart, lab work & pertinent test results  Airway Mallampati: II  TM Distance: >3 FB Neck ROM: full    Dental no notable dental hx.    Pulmonary former smoker   Pulmonary exam normal        Cardiovascular negative cardio ROS Normal cardiovascular exam     Neuro/Psych negative neurological ROS  negative psych ROS   GI/Hepatic Neg liver ROS,GERD  Controlled,,  Endo/Other  negative endocrine ROS    Renal/GU negative Renal ROS  negative genitourinary   Musculoskeletal   Abdominal   Peds  Hematology negative hematology ROS (+)   Anesthesia Other Findings Past Medical History: No date: Allergy No date: Diarrhea No date: FH: colon cancer No date: GERD (gastroesophageal reflux disease) No date: Thyroid disease  Past Surgical History: No date: CERVICAL BIOPSY 03/26/2010: CESAREAN SECTION 03/26/2009: CHOLECYSTECTOMY No date: DIAGNOSTIC LAPAROSCOPY No date: TUBAL LIGATION     Reproductive/Obstetrics negative OB ROS                             Anesthesia Physical Anesthesia Plan  ASA: 2  Anesthesia Plan: General   Post-op Pain Management:    Induction: Intravenous  PONV Risk Score and Plan: Propofol infusion and TIVA  Airway Management Planned: Natural Airway  Additional Equipment:   Intra-op Plan:   Post-operative Plan:   Informed Consent: I have reviewed the patients History and Physical, chart, labs and discussed the procedure including the risks, benefits and alternatives for the proposed anesthesia with the patient or authorized representative who has indicated his/her understanding and acceptance.     Dental Advisory Given  Plan Discussed with: CRNA and Surgeon  Anesthesia Plan Comments:         Anesthesia  Quick Evaluation

## 2022-10-23 NOTE — Anesthesia Postprocedure Evaluation (Signed)
Anesthesia Post Note  Patient: CORDA LEIBER  Procedure(s) Performed: COLONOSCOPY WITH PROPOFOL POLYPECTOMY  Patient location during evaluation: PACU Anesthesia Type: General Level of consciousness: awake and alert Pain management: pain level controlled Vital Signs Assessment: post-procedure vital signs reviewed and stable Respiratory status: spontaneous breathing, nonlabored ventilation and respiratory function stable Cardiovascular status: blood pressure returned to baseline and stable Postop Assessment: no apparent nausea or vomiting Anesthetic complications: no   No notable events documented.   Last Vitals:  Vitals:   10/23/22 0914 10/23/22 0917  BP: 90/70 109/76  Pulse: 80 83  Resp: 17 15  Temp:    SpO2: 96% 96%    Last Pain:  Vitals:   10/23/22 0917  TempSrc:   PainSc: 0-No pain                 Foye Deer

## 2022-10-23 NOTE — Interval H&P Note (Signed)
History and Physical Interval Note:  10/23/2022 8:37 AM  Alice Guerrero  has presented today for surgery, with the diagnosis of FH Colon Cancer.  The various methods of treatment have been discussed with the patient and family. After consideration of risks, benefits and other options for treatment, the patient has consented to  Procedure(s): COLONOSCOPY WITH PROPOFOL (N/A) as a surgical intervention.  The patient's history has been reviewed, patient examined, no change in status, stable for surgery.  I have reviewed the patient's chart and labs.  Questions were answered to the patient's satisfaction.     Regis Bill  Ok to proceed with colonoscopy

## 2022-10-23 NOTE — Op Note (Signed)
[facility] Patient Name: Alice Guerrero Procedure Date: 10/23/2022 8:30 AM MRN: [id number] Account #: [id number] Date of Birth: [birth year] Admit Type: Outpatient Age: 47 Room: [facility] ENDO ROOM 1 Gender: Female Note Status: Finalized Instrument Name: Nelda Marseille [id number] Procedure:             Colonoscopy Indications:           Colon cancer screening in patient at increased risk:                         Colorectal cancer in mother Providers:             Eather Colas MD, MD Referring MD:          No Local Md, MD (Referring MD) Medicines:             Monitored Anesthesia Care Complications:         No immediate complications. Estimated blood loss:                         Minimal. Procedure:             Pre-Anesthesia Assessment:                        - Prior to the procedure, a History and Physical was                         performed, and patient medications and allergies were                         reviewed. The patient is competent. The risks and                         benefits of the procedure and the sedation options and                         risks were discussed with the patient. All questions                         were answered and informed consent was obtained.                         Patient identification and proposed procedure were                         verified by the physician, the nurse, the                         anesthesiologist, the anesthetist and the technician                         in the endoscopy suite. Mental Status Examination:                         alert and oriented. Airway Examination: normal                         oropharyngeal airway and neck mobility. Respiratory  Examination: clear to auscultation. CV Examination:                         normal. Prophylactic Antibiotics: The patient does not                         require prophylactic antibiotics. Prior                          Anticoagulants: The patient has taken no anticoagulant                         or antiplatelet agents. ASA Grade Assessment: II - A                         patient with mild systemic disease. After reviewing                         the risks and benefits, the patient was deemed in                         satisfactory condition to undergo the procedure. The                         anesthesia plan was to use monitored anesthesia care                         (MAC). Immediately prior to administration of                         medications, the patient was re-assessed for adequacy                         to receive sedatives. The heart rate, respiratory                         rate, oxygen saturations, blood pressure, adequacy of                         pulmonary ventilation, and response to care were                         monitored throughout the procedure. The physical                         status of the patient was re-assessed after the                         procedure.                        After obtaining informed consent, the colonoscope was                         passed under direct vision. Throughout the procedure,                         the patient's blood pressure, pulse, and oxygen  saturations were monitored continuously. The                         Colonoscope was introduced through the anus and                         advanced to the the terminal ileum. The colonoscopy                         was performed without difficulty. The patient                         tolerated the procedure well. The quality of the bowel                         preparation was good. The terminal ileum, ileocecal                         valve, appendiceal orifice, and rectum were                         photographed. Findings:      The perianal and digital rectal examinations were normal.      The terminal ileum appeared normal.      A 1 mm polyp was found in the  ascending colon. The polyp was sessile.       The polyp was removed with a jumbo cold forceps. Resection and retrieval       were complete. Estimated blood loss was minimal.      A 3 mm polyp was found in the transverse colon. The polyp was sessile.       The polyp was removed with a cold snare. Resection and retrieval were       complete. Estimated blood loss was minimal.      Internal hemorrhoids were found during retroflexion. The hemorrhoids       were Grade I (internal hemorrhoids that do not prolapse).      The exam was otherwise without abnormality on direct and retroflexion       views. Impression:            - The examined portion of the ileum was normal.                        - One 1 mm polyp in the ascending colon, removed with                         a jumbo cold forceps. Resected and retrieved.                        - One 3 mm polyp in the transverse colon, removed with                         a cold snare. Resected and retrieved.                        - Internal hemorrhoids.                        - The examination was otherwise normal on direct and  retroflexion views. Recommendation:        - Discharge patient to home.                        - Resume previous diet.                        - Continue present medications.                        - Await pathology results.                        - Repeat colonoscopy in 5 years for surveillance.                        - Return to referring physician as previously                         scheduled. Procedure Code(s):     --- Professional ---                        [phone], Colonoscopy, flexible; with removal of                         tumor(s), polyp(s), or other lesion(s) by snare                         technique                        45380, 59, Colonoscopy, flexible; with biopsy, single                         or multiple Diagnosis Code(s):     --- Professional ---                        Z80.0,  Family history of malignant neoplasm of                         digestive organs                        K64.0, First degree hemorrhoids                        D12.2, Benign neoplasm of ascending colon                        D12.3, Benign neoplasm of transverse colon (hepatic                         flexure or splenic flexure) CPT copyright 2022 American [employer]. All rights reserved. The codes documented in this report are preliminary and upon coder review may  be revised to meet current compliance requirements. Eather Colas MD, MD 10/23/2022 9:01:33 AM Number of Addenda: 0 Note Initiated On: 10/23/2022 8:30 AM Scope Withdrawal Time: 0 hours 8 minutes 19 seconds  Total Procedure Duration: 0 hours 12 minutes 53 seconds  Estimated Blood Loss:  Estimated blood loss was minimal.      [facility]

## 2022-10-24 ENCOUNTER — Encounter: Payer: Self-pay | Admitting: Gastroenterology

## 2023-08-08 ENCOUNTER — Encounter
Admission: RE | Admit: 2023-08-08 | Discharge: 2023-08-08 | Disposition: A | Discharge status: Home / Self Care | Source: Ambulatory Visit | Attending: Obstetrics and Gynecology | Admitting: Obstetrics and Gynecology

## 2023-08-08 ENCOUNTER — Other Ambulatory Visit: Payer: Self-pay

## 2023-08-08 VITALS — Ht 65.0 in | Wt 191.0 lb

## 2023-08-08 DIAGNOSIS — N939 Abnormal uterine and vaginal bleeding, unspecified: Secondary | ICD-10-CM

## 2023-08-08 DIAGNOSIS — Z01818 Encounter for other preprocedural examination: Secondary | ICD-10-CM

## 2023-08-08 DIAGNOSIS — D649 Anemia, unspecified: Secondary | ICD-10-CM

## 2023-08-08 DIAGNOSIS — Z01812 Encounter for preprocedural laboratory examination: Secondary | ICD-10-CM

## 2023-08-08 HISTORY — DX: Other postprocedural complications and disorders of digestive system: K91.89

## 2023-08-08 HISTORY — DX: Anemia, unspecified: D64.9

## 2023-08-08 HISTORY — DX: Personal history of nicotine dependence: Z87.891

## 2023-08-08 HISTORY — DX: Anxiety disorder, unspecified: F41.9

## 2023-08-08 HISTORY — DX: Abnormal uterine and vaginal bleeding, unspecified: N93.9

## 2023-08-08 NOTE — Patient Instructions (Addendum)
 Your procedure is scheduled on:08-16-23 Friday Report to the Registration Desk on the 1st floor of the [facility].Then proceed to the 2nd floor Surgery Desk To find out your arrival time, please call [phone] between 1PM - 3PM on:08-15-23 Thursday If your arrival time is 6:00 am, do not arrive before that time as the [facility] entrance doors do not open until 6:00 am.  REMEMBER: Instructions that are not followed completely may result in serious medical risk, up to and including death; or upon the discretion of your surgeon and anesthesiologist your surgery may need to be rescheduled.  Do not eat food after midnight the night before surgery.  No gum chewing or hard candies.  You may however, drink CLEAR liquids up to 2 hours before you are scheduled to arrive for your surgery. Do not drink anything within 2 hours of your scheduled arrival time.  Clear liquids include: - water  - apple juice without pulp - gatorade (not RED colors) - black coffee or tea (Do NOT add milk or creamers to the coffee or tea) Do NOT drink anything that is not on this list.  One week prior to surgery:Stop NOW (08-08-23) Stop Anti-inflammatories (NSAIDS) such as Advil, Aleve, Ibuprofen, Motrin, Naproxen, Naprosyn and Aspirin based products such as Excedrin, Goody's Powder, BC Powder. Stop ANY OVER THE COUNTER supplements until after surgery.  You may however, continue to take Tylenol if needed for pain up until the day of surgery.  Continue taking all of your other prescription medications up until the day of surgery.  ON THE DAY OF SURGERY ONLY TAKE THESE MEDICATIONS WITH SIPS OF WATER: -buPROPion (WELLBUTRIN XL)   No Alcohol for 24 hours before or after surgery.  No Smoking including e-cigarettes for 24 hours before surgery.  No chewable tobacco products for at least 6 hours before surgery.  No nicotine patches on the day of surgery.  Do not use any "recreational" drugs for at least a week  (preferably 2 weeks) before your surgery.  Please be advised that the combination of cocaine and anesthesia may have negative outcomes, up to and including death. If you test positive for cocaine, your surgery will be cancelled.  On the morning of surgery brush your teeth with toothpaste and water, you may rinse your mouth with mouthwash if you wish. Do not swallow any toothpaste or mouthwash.  Do not wear jewelry, make-up, hairpins, clips or nail polish.  For welded (permanent) jewelry: bracelets, anklets, waist bands, etc.  Please have this removed prior to surgery.  If it is not removed, there is a chance that hospital personnel will need to cut it off on the day of surgery.  Do not wear lotions, powders, or perfumes.   Do not shave body hair from the neck down 48 hours before surgery.  Contact lenses, hearing aids and dentures may not be worn into surgery.  Do not bring valuables to the hospital. [facility] is not responsible for any missing/lost belongings or valuables.   Notify your doctor if there is any change in your medical condition (cold, fever, infection).  Wear comfortable clothing (specific to your surgery type) to the hospital.  After surgery, you can help prevent lung complications by doing breathing exercises.  Take deep breaths and cough every 1-2 hours. Your doctor may order a device called an Incentive Spirometer to help you take deep breaths. When coughing or sneezing, hold a pillow firmly against your incision with both hands. This is called "splinting." Doing  this helps protect your incision. It also decreases belly discomfort.  If you are being admitted to the hospital overnight, leave your suitcase in the car. After surgery it may be brought to your room.  In case of increased patient census, it may be necessary for you, the patient, to continue your postoperative care in the Same Day Surgery department.  If you are being discharged the day of surgery, you  will not be allowed to drive home. You will need a responsible individual to drive you home and stay with you for 24 hours after surgery.   If you are taking public transportation, you will need to have a responsible individual with you.  Please call the Pre-admissions Testing Dept. at [phone] if you have any questions about these instructions.  Surgery Visitation Policy:  Patients having surgery or a procedure may have two visitors.  Children under the age of 63 must have an adult with them who is not the patient.

## 2023-08-09 NOTE — H&P (Signed)
 Chief Complaint:  Alice Guerrero is a 48 y.o. G72P3003 female here for Pre Op Consulting   Referring provider: Aurelia Blotter*  History of Present Illness:  Pt presenting for [facility] hysteroscopy preop for AUB with a 3.3cm ES but EMBx with no tissue identified.   TVUS: see chart for full report Endovaginal ultrasound performed today due to the indica??ons outlined above. The sonogram reveals a normal appearing uterus. The endometrium appears thickened measuring 33.36mm(avg) The ovaries were visualized bilaterally and appear normal; Lt ovary contains a simple cys  Results      Pertinent Hx: -Always regular periods, this is new and concerning  Past Medical History:  has a past medical history of Chronic diarrhea, Headache, History of abnormal cervical Pap smear, History of cholelithiasis, and stress incontinence.  Past Surgical History:  has a past surgical history that includes Cervical biopsy w/ loop electrode excision; Cholecystectomy (N/A); Cesarean section (N/A, 2012); Mini-laparotomy w/ tubal ligation (Bilateral, 2012); Colon @ [facility] (10/23/2022); and Tubal ligation (2012). Family History: family history includes Brain cancer in her maternal grandmother; Cancer in her maternal grandfather; Clotting disorder in her father and paternal grandfather; Colon cancer (age of onset: 42) in her mother; Glaucoma in her paternal grandfather and paternal grandmother; Hyperlipidemia (Elevated cholesterol) in her father; Liver disease in her brother; Lung cancer in her maternal grandfather; Stroke in her mother. Social History:  reports that she quit smoking about 17 years ago. Her smoking use included cigarettes. She has never used smokeless tobacco. She reports that she does not currently use alcohol after a past usage of about 1.0 standard drink of alcohol per week. She reports that she does not use drugs. OB/GYN History:  OB History    Gravida 3  Para 3  Term 3  Preterm     AB    Living 3    SAB    IAB    Ectopic    Molar    Multiple    Live Births 3      Allergies: has No Known Allergies. Medications: Current Outpatient Medications:    buPROPion (WELLBUTRIN XL) 150 MG XL tablet, Take 1 tablet (150 mg total) by mouth once daily for 90 days, Disp: 90 tablet, Rfl: 0   rimegepant (NURTEC ODT) 75 mg disintegrating tablet, Take by mouth, Disp: , Rfl:    UBRELVY 100 mg Tab, , Disp: , Rfl:    colestipoL (COLESTID) 1 gram tablet, Take 1 tablet (1 g total) by mouth once daily Take before meals (Patient not taking: Reported on 08/09/2023), Disp: 30 tablet, Rfl: 0   medroxyPROGESTERone (PROVERA) 10 MG tablet, Take 2 tablets (20 mg total) by mouth 3 (three) times daily for 5 days, Disp: 30 tablet, Rfl: 0   rosuvastatin (CRESTOR) 5 MG tablet, 1 tablet (Patient not taking: Reported on 08/09/2023), Disp: , Rfl:    sodium, potassium, and magnesium (SUPREP) oral solution, Take 1 Bottle by mouth as directed One kit contains 2 bottles.  Take both bottles at the times instructed by your provider. (Patient not taking: Reported on 07/05/2023), Disp: 354 mL, Rfl: 0  Review of Systems: No SOB, no palpitations or chest pain, no new lower extremity edema, no nausea or vomiting or bowel or bladder complaints. See HPI for gyn specific ROS.   Exam:  Vitals:  08/09/23 1555 BP: (!) 135/94 Pulse: 97    Constitutional:  General appearance: Well nourished, well developed female in no acute distress.  Neuro/psych:  Normal mood and affect. No  gross motor deficits. Neck:  Supple, normal appearance.  Respiratory:  Normal respiratory effort, no use of accessory muscles Skin:  No visible rashes or external lesions     Pelvic:  deferred  Impression:  The encounter diagnosis was Abnormal uterine bleeding (AUB).  Plan:   - 1.  Preoperative visit: D&C hysteroscopy, possible polypectomy. Consents signed today.  -Risks of surgery were discussed with the patient  including but not limited to: bleeding which may require transfusion; infection which may require antibiotics; injury to uterus or surrounding organs; intrauterine scarring which may impair future fertility; need for additional procedures including laparotomy or laparoscopy; and other postoperative/anesthesia complications. Written informed consent was obtained.  This is a scheduled same-day surgery. She will have a postop visit in 2 weeks to review operative findings and pathology.  Diagnoses and all orders for this visit:  Abnormal uterine bleeding (AUB)    Brannon Calamity, MD

## 2023-08-13 ENCOUNTER — Encounter
Admission: RE | Admit: 2023-08-13 | Discharge: 2023-08-13 | Disposition: A | Discharge status: Home / Self Care | Source: Ambulatory Visit | Attending: Obstetrics and Gynecology | Admitting: Obstetrics and Gynecology

## 2023-08-13 DIAGNOSIS — N939 Abnormal uterine and vaginal bleeding, unspecified: Secondary | ICD-10-CM | POA: Insufficient documentation

## 2023-08-13 DIAGNOSIS — Z01812 Encounter for preprocedural laboratory examination: Secondary | ICD-10-CM | POA: Diagnosis not present

## 2023-08-13 DIAGNOSIS — D649 Anemia, unspecified: Secondary | ICD-10-CM | POA: Diagnosis not present

## 2023-08-13 DIAGNOSIS — Z01818 Encounter for other preprocedural examination: Secondary | ICD-10-CM | POA: Diagnosis present

## 2023-08-13 LAB — CBC
HCT: 27.6 % — ABNORMAL LOW (ref 36.0–46.0)
Hemoglobin: 9 g/dL — ABNORMAL LOW (ref 12.0–15.0)
MCH: 29.9 pg (ref 26.0–34.0)
MCHC: 32.6 g/dL (ref 30.0–36.0)
MCV: 91.7 fL (ref 80.0–100.0)
Platelets: 546 10*3/uL — ABNORMAL HIGH (ref 150–400)
RBC: 3.01 MIL/uL — ABNORMAL LOW (ref 3.87–5.11)
RDW: 11.9 % (ref 11.5–15.5)
WBC: 5.3 10*3/uL (ref 4.0–10.5)
nRBC: 0.4 % — ABNORMAL HIGH (ref 0.0–0.2)

## 2023-08-15 MED ORDER — ORAL CARE MOUTH RINSE: 15.0000 mL | Freq: Once | OROMUCOSAL | Status: AC

## 2023-08-15 MED ORDER — LACTATED RINGERS IV SOLN: INTRAVENOUS | Status: DC

## 2023-08-15 MED ORDER — CHLORHEXIDINE GLUCONATE 0.12 % MT SOLN
15.0000 mL | Freq: Once | OROMUCOSAL | Status: AC
  Administered 2023-08-16: 15 mL via OROMUCOSAL

## 2023-08-15 MED ORDER — POVIDONE-IODINE 10 % EX SWAB: 2.0000 | Freq: Once | CUTANEOUS | Status: DC

## 2023-08-16 ENCOUNTER — Ambulatory Visit: Admitting: Urgent Care

## 2023-08-16 ENCOUNTER — Ambulatory Visit: Admitting: Certified Registered"

## 2023-08-16 ENCOUNTER — Other Ambulatory Visit: Payer: Self-pay

## 2023-08-16 ENCOUNTER — Encounter
Admission: RE | Disposition: A | Discharge status: Home / Self Care | Payer: Self-pay | Source: Home / Self Care | Attending: Obstetrics and Gynecology

## 2023-08-16 ENCOUNTER — Ambulatory Visit
Admission: RE | Admit: 2023-08-16 | Discharge: 2023-08-16 | Disposition: A | Discharge status: Home / Self Care | Attending: Obstetrics and Gynecology | Admitting: Obstetrics and Gynecology

## 2023-08-16 ENCOUNTER — Encounter: Payer: Self-pay | Admitting: Obstetrics and Gynecology

## 2023-08-16 DIAGNOSIS — Z6831 Body mass index (BMI) 31.0-31.9, adult: Secondary | ICD-10-CM | POA: Diagnosis not present

## 2023-08-16 DIAGNOSIS — N921 Excessive and frequent menstruation with irregular cycle: Secondary | ICD-10-CM

## 2023-08-16 DIAGNOSIS — Z87891 Personal history of nicotine dependence: Secondary | ICD-10-CM | POA: Insufficient documentation

## 2023-08-16 DIAGNOSIS — Z79899 Other long term (current) drug therapy: Secondary | ICD-10-CM | POA: Insufficient documentation

## 2023-08-16 DIAGNOSIS — K219 Gastro-esophageal reflux disease without esophagitis: Secondary | ICD-10-CM | POA: Diagnosis not present

## 2023-08-16 DIAGNOSIS — E66813 Obesity, class 3: Secondary | ICD-10-CM | POA: Insufficient documentation

## 2023-08-16 DIAGNOSIS — Z01818 Encounter for other preprocedural examination: Secondary | ICD-10-CM

## 2023-08-16 DIAGNOSIS — N939 Abnormal uterine and vaginal bleeding, unspecified: Secondary | ICD-10-CM | POA: Insufficient documentation

## 2023-08-16 DIAGNOSIS — N840 Polyp of corpus uteri: Secondary | ICD-10-CM | POA: Insufficient documentation

## 2023-08-16 HISTORY — PX: CERVICAL POLYPECTOMY: SHX88

## 2023-08-16 HISTORY — PX: HYSTEROSCOPY WITH D & C: SHX1775

## 2023-08-16 LAB — CBC
HCT: 26.9 % — ABNORMAL LOW (ref 36.0–46.0)
Hemoglobin: 8.5 g/dL — ABNORMAL LOW (ref 12.0–15.0)
MCH: 29.1 pg (ref 26.0–34.0)
MCHC: 31.6 g/dL (ref 30.0–36.0)
MCV: 92.1 fL (ref 80.0–100.0)
Platelets: 564 10*3/uL — ABNORMAL HIGH (ref 150–400)
RBC: 2.92 MIL/uL — ABNORMAL LOW (ref 3.87–5.11)
RDW: 11.9 % (ref 11.5–15.5)
WBC: 7.2 10*3/uL (ref 4.0–10.5)
nRBC: 0 % (ref 0.0–0.2)

## 2023-08-16 LAB — POCT PREGNANCY, URINE: Preg Test, Ur: NEGATIVE

## 2023-08-16 SURGERY — DILATATION AND CURETTAGE /HYSTEROSCOPY
Anesthesia: General | Site: Cervix

## 2023-08-16 MED ORDER — LIDOCAINE HCL (PF) 2 % IJ SOLN
INTRAMUSCULAR | Status: AC
  Filled 2023-08-16: qty 5

## 2023-08-16 MED ORDER — FENTANYL CITRATE (PF) 100 MCG/2ML IJ SOLN: 25.0000 ug | INTRAMUSCULAR | Status: DC | PRN

## 2023-08-16 MED ORDER — KETOROLAC TROMETHAMINE 30 MG/ML IJ SOLN
INTRAMUSCULAR | Status: AC
Start: 2023-08-16 — End: ?
  Filled 2023-08-16: qty 1

## 2023-08-16 MED ORDER — ONDANSETRON HCL 4 MG/2ML IJ SOLN
INTRAMUSCULAR | Status: DC | PRN
  Administered 2023-08-16: 4 mg via INTRAVENOUS

## 2023-08-16 MED ORDER — DEXAMETHASONE SODIUM PHOSPHATE 10 MG/ML IJ SOLN
INTRAMUSCULAR | Status: DC | PRN
  Administered 2023-08-16: 10 mg via INTRAVENOUS

## 2023-08-16 MED ORDER — MIDAZOLAM HCL 2 MG/2ML IJ SOLN
INTRAMUSCULAR | Status: AC
  Filled 2023-08-16: qty 2

## 2023-08-16 MED ORDER — KETOROLAC TROMETHAMINE 30 MG/ML IJ SOLN
INTRAMUSCULAR | Status: DC | PRN
  Administered 2023-08-16: 15 mg via INTRAVENOUS

## 2023-08-16 MED ORDER — LIDOCAINE HCL (CARDIAC) PF 100 MG/5ML IV SOSY
PREFILLED_SYRINGE | INTRAVENOUS | Status: DC | PRN
  Administered 2023-08-16: 60 mg via INTRAVENOUS

## 2023-08-16 MED ORDER — CHLORHEXIDINE GLUCONATE 0.12 % MT SOLN
OROMUCOSAL | Status: AC
  Filled 2023-08-16: qty 15

## 2023-08-16 MED ORDER — DEXMEDETOMIDINE HCL IN NACL 80 MCG/20ML IV SOLN
INTRAVENOUS | Status: DC | PRN
  Administered 2023-08-16: 8 ug via INTRAVENOUS

## 2023-08-16 MED ORDER — 0.9 % SODIUM CHLORIDE (POUR BTL) OPTIME
TOPICAL | Status: DC | PRN
  Administered 2023-08-16: 500 mL

## 2023-08-16 MED ORDER — PROPOFOL 10 MG/ML IV BOLUS
INTRAVENOUS | Status: AC
  Filled 2023-08-16: qty 20

## 2023-08-16 MED ORDER — FENTANYL CITRATE (PF) 100 MCG/2ML IJ SOLN
INTRAMUSCULAR | Status: AC
  Filled 2023-08-16: qty 2

## 2023-08-16 MED ORDER — OXYCODONE HCL 5 MG/5ML PO SOLN: 5.0000 mg | Freq: Once | ORAL | Status: DC | PRN

## 2023-08-16 MED ORDER — SUCCINYLCHOLINE CHLORIDE 200 MG/10ML IV SOSY
PREFILLED_SYRINGE | INTRAVENOUS | Status: DC | PRN
  Administered 2023-08-16: 120 mg via INTRAVENOUS

## 2023-08-16 MED ORDER — MIDAZOLAM HCL 5 MG/5ML IJ SOLN
INTRAMUSCULAR | Status: DC | PRN
  Administered 2023-08-16: 2 mg via INTRAVENOUS

## 2023-08-16 MED ORDER — FENTANYL CITRATE (PF) 100 MCG/2ML IJ SOLN
INTRAMUSCULAR | Status: DC | PRN
Start: 2023-08-16 — End: 2023-08-16
  Administered 2023-08-16 (×2): 50 ug via INTRAVENOUS

## 2023-08-16 MED ORDER — SILVER NITRATE-POT NITRATE 75-25 % EX MISC
CUTANEOUS | Status: AC
  Filled 2023-08-16: qty 10

## 2023-08-16 MED ORDER — ONDANSETRON HCL 4 MG/2ML IJ SOLN: 4.0000 mg | Freq: Once | INTRAMUSCULAR | Status: DC | PRN

## 2023-08-16 MED ORDER — OXYCODONE HCL 5 MG PO TABS: 5.0000 mg | ORAL_TABLET | Freq: Once | ORAL | Status: DC | PRN

## 2023-08-16 MED ORDER — PROPOFOL 10 MG/ML IV BOLUS
INTRAVENOUS | Status: DC | PRN
  Administered 2023-08-16: 200 mg via INTRAVENOUS

## 2023-08-16 MED ORDER — PHENYLEPHRINE 80 MCG/ML (10ML) SYRINGE FOR IV PUSH (FOR BLOOD PRESSURE SUPPORT)
PREFILLED_SYRINGE | INTRAVENOUS | Status: DC | PRN
  Administered 2023-08-16 (×2): 80 ug via INTRAVENOUS

## 2023-08-16 SURGICAL SUPPLY — 22 items
BAG PRESSURE INF REUSE 1000 (BAG) ×1 IMPLANT
BASIN KIT SINGLE STR (MISCELLANEOUS) ×1 IMPLANT
DEVICE MYOSURE LITE (MISCELLANEOUS) IMPLANT
DEVICE MYOSURE REACH (MISCELLANEOUS) IMPLANT
DRSG TELFA 3X4 N-ADH STERILE (GAUZE/BANDAGES/DRESSINGS) IMPLANT
DRSG TELFA 3X8 NADH STRL (GAUZE/BANDAGES/DRESSINGS) IMPLANT
ELECTRODE REM PT RTRN 9FT ADLT (ELECTROSURGICAL) ×1 IMPLANT
GLOVE BIO SURGEON STRL SZ7 (GLOVE) ×1 IMPLANT
GLOVE INDICATOR 7.5 STRL GRN (GLOVE) ×1 IMPLANT
GOWN STRL REUS W/ TWL LRG LVL3 (GOWN DISPOSABLE) ×2 IMPLANT
KIT PROCEDURE FLUENT (KITS) ×1 IMPLANT
KIT TURNOVER CYSTO (KITS) ×1 IMPLANT
MANIFOLD NEPTUNE II (INSTRUMENTS) ×1 IMPLANT
PACK DNC HYST (MISCELLANEOUS) ×1 IMPLANT
PAD PREP OB/GYN DISP 24X41 (PERSONAL CARE ITEMS) ×1 IMPLANT
SCRUB CHG 4% DYNA-HEX 4OZ (MISCELLANEOUS) ×1 IMPLANT
SEAL ROD LENS SCOPE MYOSURE (ABLATOR) IMPLANT
SET CYSTO W/LG BORE CLAMP LF (SET/KITS/TRAYS/PACK) IMPLANT
SOL .9 NS 3000ML IRR UROMATIC (IV SOLUTION) ×1 IMPLANT
TRAP FLUID SMOKE EVACUATOR (MISCELLANEOUS) ×1 IMPLANT
TUBING CONNECTING 10 (TUBING) ×1 IMPLANT
WATER STERILE IRR 500ML POUR (IV SOLUTION) ×1 IMPLANT

## 2023-08-16 NOTE — Anesthesia Postprocedure Evaluation (Signed)
 Anesthesia Post Note  Patient: JOURNE HALLMARK  Procedure(s) Performed: DILATATION AND CURETTAGE /HYSTEROSCOPY (Cervix) POLYPECTOMY, CERVIX (Cervix)  Patient location during evaluation: PACU Anesthesia Type: General Level of consciousness: awake and alert, oriented and patient cooperative Pain management: pain level controlled Vital Signs Assessment: post-procedure vital signs reviewed and stable Respiratory status: spontaneous breathing, nonlabored ventilation and respiratory function stable Cardiovascular status: blood pressure returned to baseline and stable Postop Assessment: adequate PO intake Anesthetic complications: no   No notable events documented.   Last Vitals:  Vitals:   08/16/23 1535 08/16/23 1540  BP:    Pulse: (!) 101 98  Resp: 20 16  Temp:    SpO2: 94% 96%    Last Pain:  Vitals:   08/16/23 1545  PainSc: 0-No pain                 Dorothey Gate

## 2023-08-16 NOTE — Op Note (Signed)
 Operative Report Hysteroscopy with Dilation and Curettage   Indications: Abnormal uterine bleeding   Pre-operative Diagnosis: thickened endometrial stripe   Post-operative Diagnosis: same.  Procedure: 1. Exam under anesthesia 2. Fractional D&C 3. Hysteroscopy 4. Myosure sampling  Surgeon: Prescilla Brod, MD  Assistant(s):  None  Anesthesia: General LMA anesthesia  Anesthesiologist: Van Staveren, Dante Dyer, MD Anesthesiologist: Elysa Hancock, Dante Dyer, MD CRNA: Delice Felt, CRNA; Donnamae Gaba, Pension scheme manager Nurse Anesthetist: Lourena Royal, RN  Estimated Blood Loss:  Minimal  Total IV Fluids:  Urine Output:  Total Fluid Deficit:  185 mL          Specimens: Endocervical curettings, endometrial curettings         Complications:  None; patient tolerated the procedure well.         Disposition: PACU - hemodynamically stable.         Condition: stable  Findings: Uterus measuring 11 cm by sound; normal cervix, vagina, perineum. Proliferative tissue, uterine cavity wnl   Indication for procedure/Consents: 48 y.o. F here for scheduled surgery for the aforementioned diagnoses.     Risks of surgery were discussed with the patient including but not limited to: bleeding which may require transfusion; infection which may require antibiotics; injury to uterus or surrounding organs; intrauterine scarring which may impair future fertility; need for additional procedures including laparotomy or laparoscopy; and other postoperative/anesthesia complications. Written informed consent was obtained.    Procedure Details:   D&C/ Myosure  The patient was taken to the operating room where anesthesia was administered and was found to be adequate.  After a formal and adequate timeout was performed, she was placed in the dorsal lithotomy position and examined with the above findings. She was then prepped and draped in the sterile manner.   Her bladder was  catheterized for an estimated amount of clear, yellow urine. A weighed speculum was then placed in the patient's vagina and a single tooth tenaculum was applied to the anterior lip of the cervix.  An endocervical curette was performed. Her cervix was serially dilated to 15 [place] using Hanks dilators. The hysteroscope was introduced under direct observation  Using lactated ringers as a distention medium to reveal the above findings. The uterine cavity was carefully examined, both ostia were recognized, and diffusely proliferative endometrium above were noted.   This was resected using the Myosure device.  After further careful visualization of the uterine cavity, the hysteroscope was removed under direct visualization.  A sharp curettage was then performed until there was a gritty texture in all four quadrants.  The tenaculum was removed from the anterior lip of the cervix and the vaginal speculum was removed after applying silver nitrate/pressure for good hemostasis.   The patient tolerated the procedure well and was taken to the recovery area awake and in stable condition. She received iv acetaminophen and Toradol prior to leaving the OR.  The patient will be discharged to home as per PACU criteria. Routine postoperative instructions given.  She was prescribed Ibuprofen and Colace.  She will follow up in the clinic in two weeks for postoperative evaluation.

## 2023-08-16 NOTE — Anesthesia Preprocedure Evaluation (Addendum)
 Anesthesia Evaluation  Patient identified by MRN, date of birth, ID band Patient awake    Reviewed: Allergy & Precautions, NPO status , Patient's Chart, lab work & pertinent test results  Airway Mallampati: II  TM Distance: >3 FB Neck ROM: Full    Dental  (+) Teeth Intact   Pulmonary Patient abstained from smoking., former smoker   Pulmonary exam normal breath sounds clear to auscultation       Cardiovascular Exercise Tolerance: Good negative cardio ROS Normal cardiovascular exam Rhythm:Regular Rate:Normal     Neuro/Psych negative neurological ROS  negative psych ROS   GI/Hepatic negative GI ROS, Neg liver ROS,GERD  Medicated,,  Endo/Other  negative endocrine ROS  Class 3 obesity  Renal/GU negative Renal ROS     Musculoskeletal   Abdominal  (+) + obese  Peds negative pediatric ROS (+)  Hematology negative hematology ROS (+)   Anesthesia Other Findings Past Medical History: No date: Abnormal uterine bleeding (AUB) No date: Allergy No date: Anemia No date: Anxiety No date: FH: colon cancer No date: Former smoker No date: GERD (gastroesophageal reflux disease) No date: Postcholecystectomy diarrhea No date: Thyroid  disease  Past Surgical History: No date: CERVICAL BIOPSY 03/26/2010: CESAREAN SECTION 03/26/2009: CHOLECYSTECTOMY 10/23/2022: COLONOSCOPY WITH PROPOFOL ; N/A     Comment:  Procedure: COLONOSCOPY WITH PROPOFOL ;  Surgeon:               Shane Darling, MD;  Location: [facility] ENDOSCOPY;                Service: Endoscopy;  Laterality: N/A; No date: DIAGNOSTIC LAPAROSCOPY 10/23/2022: POLYPECTOMY     Comment:  Procedure: POLYPECTOMY;  Surgeon: Shane Darling,               MD;  Location: [facility] ENDOSCOPY;  Service: Endoscopy;; No date: TUBAL LIGATION  BMI    Body Mass Index: 31.77 kg/m      Reproductive/Obstetrics negative OB ROS                              Anesthesia Physical Anesthesia Plan  ASA: 2  Anesthesia Plan: General   Post-op Pain Management:    Induction: Intravenous  PONV Risk Score and Plan: Ondansetron, Dexamethasone, Midazolam and Treatment may vary due to age or medical condition  Airway Management Planned: Oral ETT  Additional Equipment:   Intra-op Plan:   Post-operative Plan: Extubation in OR  Informed Consent: I have reviewed the patients History and Physical, chart, labs and discussed the procedure including the risks, benefits and alternatives for the proposed anesthesia with the patient or authorized representative who has indicated his/her understanding and acceptance.     Dental Advisory Given  Plan Discussed with: CRNA  Anesthesia Plan Comments:        Anesthesia Quick Evaluation

## 2023-08-16 NOTE — Transfer of Care (Signed)
 Immediate Anesthesia Transfer of Care Note  Patient: Alice Guerrero  Procedure(s) Performed: DILATATION AND CURETTAGE /HYSTEROSCOPY (Cervix) POLYPECTOMY, CERVIX (Cervix)  Patient Location: PACU  Anesthesia Type:General  Level of Consciousness: drowsy  Airway & Oxygen Therapy: Patient Spontanous Breathing and Patient connected to face mask oxygen  Post-op Assessment: Report given to RN and Post -op Vital signs reviewed and stable  Post vital signs: Reviewed and stable  Last Vitals:  Vitals Value Taken Time  BP 132/76 08/16/23 1504  Temp    Pulse 102 08/16/23 1507  Resp 26 08/16/23 1507  SpO2 100 % 08/16/23 1507  Vitals shown include unfiled device data.  Last Pain:  Vitals:   08/16/23 1317  PainSc: 2          Complications: No notable events documented.

## 2023-08-16 NOTE — Interval H&P Note (Signed)
 History and Physical Interval Note:  08/16/2023 2:11 PM  Alice Guerrero  has presented today for surgery, with the diagnosis of Abnormal uterine bleeding 8 cm endo stripe.  The various methods of treatment have been discussed with the patient and family. After consideration of risks, benefits and other options for treatment, the patient has consented to  Procedure(s) with comments: DILATATION AND CURETTAGE /HYSTEROSCOPY (N/A) - Hysteroscopy D&C POLYPECTOMY, CERVIX (N/A) as a surgical intervention.  The patient's history has been reviewed, patient examined, no change in status, stable for surgery.  I have reviewed the patient's chart and labs.  Questions were answered to the patient's satisfaction.     Prescilla Brod

## 2023-08-16 NOTE — Discharge Instructions (Signed)
 Discharge instructions after a hysteroscopy with dilation and curettage  Signs and Symptoms to Report  Call our office at [phone] if you have any of the following:    Fever over 100.4 degrees or higher  Severe stomach pain not relieved with pain medications  Bright red bleeding that's heavier than a period that does not slow with rest after the first 24 hours  To go the bathroom a lot (frequency), you can't hold your urine (urgency), or it hurts when you empty your bladder (urinate)  Chest pain  Shortness of breath  Pain in the calves of your legs  Severe nausea and vomiting not relieved with anti-nausea medications  Any concerns  What You Can Expect after Surgery  You may see some pink tinged, bloody fluid. This is normal. You may also have cramping for several days.   Activities after Your Discharge Follow these guidelines to help speed your recovery at home:  Don't drive if you are in pain or taking narcotic pain medicine. You may drive when you can safely slam on the brakes, turn the wheel forcefully, and rotate your torso comfortably. This is typically 4-7 days. Practice in a parking lot or side street prior to attempting to drive regularly.   Ask others to help with household chores for 4 weeks.  Don't do strenuous activities, exercises, or sports like vacuuming, tennis, squash, etc. until your doctor says it is safe to do so.  Walk as you feel able. Rest often since it may take a week or two for your energy level to return to normal.   You may climb stairs  Avoid constipation:   -Eat fruits, vegetables, and whole grains. Eat small meals as your appetite will take time to return to normal.   -Drink 6 to 8 glasses of water each day unless your doctor has told you to limit your fluids.   -Use a laxative or stool softener as needed if constipation becomes a problem. You may take Miralax, metamucil, Citrucil, Colace, Senekot, FiberCon, etc. If this does not relieve the  constipation, try two tablespoons of Milk Of Magnesia every 8 hours until your bowels move.   You may shower.   Do not get in a hot tub, swimming pool, etc. until your doctor agrees.  Do not douche, use tampons, or have sex until your doctor says it is okay, usually about 2 weeks.  Take your pain medicine when you need it. The medicine may not work as well if the pain is bad.  Take the medicines you were taking before surgery. Other medications you might need are pain medications (ibuprofen), medications for constipation (Colace) and nausea medications (Zofran).

## 2023-08-16 NOTE — Anesthesia Procedure Notes (Signed)
 Procedure Name: Intubation Date/Time: 08/16/2023 2:24 PM  Performed by: Delice Felt, CRNAPre-anesthesia Checklist: Patient identified, Patient being monitored, Timeout performed, Emergency Drugs available and Suction available Patient Re-evaluated:Patient Re-evaluated prior to induction Oxygen Delivery Method: Circle system utilized Preoxygenation: Pre-oxygenation with 100% oxygen Induction Type: IV induction Ventilation: Mask ventilation without difficulty Laryngoscope Size: 3 and McGrath Grade View: Grade I Tube type: Oral Tube size: 7.0 mm Number of attempts: 1 Airway Equipment and Method: Stylet Placement Confirmation: ETT inserted through vocal cords under direct vision, positive ETCO2 and breath sounds checked- equal and bilateral Secured at: 21 cm Tube secured with: Tape Dental Injury: Teeth and Oropharynx as per pre-operative assessment

## 2023-08-17 ENCOUNTER — Encounter: Payer: Self-pay | Admitting: Obstetrics and Gynecology

## 2023-08-20 LAB — SURGICAL PATHOLOGY

## 2023-09-03 ENCOUNTER — Other Ambulatory Visit: Payer: Self-pay | Admitting: Family Medicine

## 2023-09-03 DIAGNOSIS — Z1231 Encounter for screening mammogram for malignant neoplasm of breast: Secondary | ICD-10-CM

## 2023-09-20 ENCOUNTER — Ambulatory Visit
Admission: RE | Admit: 2023-09-20 | Discharge: 2023-09-20 | Disposition: A | Discharge status: Home / Self Care | Source: Ambulatory Visit | Attending: Family Medicine | Admitting: Family Medicine

## 2023-09-20 DIAGNOSIS — Z1231 Encounter for screening mammogram for malignant neoplasm of breast: Secondary | ICD-10-CM | POA: Diagnosis present
